# Patient Record
Sex: Female | Born: 1950 | Race: White | Hispanic: No | Smoking: Former smoker
Health system: Southern US, Community
[De-identification: ages and names within clinical notes are randomized; demographics above are authoritative.]

## PROBLEM LIST (undated history)

## (undated) DIAGNOSIS — C801 Malignant (primary) neoplasm, unspecified: Secondary | ICD-10-CM

## (undated) DIAGNOSIS — F32A Depression, unspecified: Secondary | ICD-10-CM

## (undated) DIAGNOSIS — F419 Anxiety disorder, unspecified: Secondary | ICD-10-CM

## (undated) DIAGNOSIS — F329 Major depressive disorder, single episode, unspecified: Secondary | ICD-10-CM

## (undated) DIAGNOSIS — I10 Essential (primary) hypertension: Secondary | ICD-10-CM

## (undated) DIAGNOSIS — C50919 Malignant neoplasm of unspecified site of unspecified female breast: Secondary | ICD-10-CM

## (undated) HISTORY — DX: Depression, unspecified: F32.A

## (undated) HISTORY — DX: Major depressive disorder, single episode, unspecified: F32.9

---

## 1996-10-30 HISTORY — PX: BREAST LUMPECTOMY: SHX2

## 1998-01-28 ENCOUNTER — Encounter: Admission: RE | Admit: 1998-01-28 | Discharge: 1998-04-28 | Payer: Self-pay | Admitting: *Deleted

## 1998-02-05 ENCOUNTER — Inpatient Hospital Stay (HOSPITAL_COMMUNITY): Admission: RE | Admit: 1998-02-05 | Discharge: 1998-02-07 | Payer: Self-pay | Admitting: Neurosurgery

## 1998-05-13 ENCOUNTER — Ambulatory Visit (HOSPITAL_COMMUNITY): Admission: RE | Admit: 1998-05-13 | Discharge: 1998-05-13 | Payer: Self-pay | Admitting: Endocrinology

## 1998-12-09 ENCOUNTER — Encounter: Payer: Self-pay | Admitting: Endocrinology

## 1998-12-09 ENCOUNTER — Ambulatory Visit (HOSPITAL_COMMUNITY): Admission: RE | Admit: 1998-12-09 | Discharge: 1998-12-09 | Payer: Self-pay | Admitting: Endocrinology

## 1999-05-09 ENCOUNTER — Other Ambulatory Visit: Admission: RE | Admit: 1999-05-09 | Discharge: 1999-05-09 | Payer: Self-pay | Admitting: Obstetrics and Gynecology

## 2000-10-08 ENCOUNTER — Encounter: Payer: Self-pay | Admitting: Oncology

## 2000-10-08 ENCOUNTER — Encounter: Admission: RE | Admit: 2000-10-08 | Discharge: 2000-10-08 | Payer: Self-pay | Admitting: Oncology

## 2001-11-08 ENCOUNTER — Encounter: Payer: Self-pay | Admitting: Oncology

## 2001-11-08 ENCOUNTER — Encounter: Admission: RE | Admit: 2001-11-08 | Discharge: 2001-11-08 | Payer: Self-pay | Admitting: Oncology

## 2002-03-11 ENCOUNTER — Encounter: Payer: Self-pay | Admitting: Oncology

## 2002-03-11 ENCOUNTER — Ambulatory Visit (HOSPITAL_COMMUNITY): Admission: RE | Admit: 2002-03-11 | Discharge: 2002-03-11 | Payer: Self-pay | Admitting: Oncology

## 2003-01-02 ENCOUNTER — Encounter: Admission: RE | Admit: 2003-01-02 | Discharge: 2003-01-02 | Payer: Self-pay | Admitting: Oncology

## 2003-01-02 ENCOUNTER — Encounter: Payer: Self-pay | Admitting: Oncology

## 2004-01-04 ENCOUNTER — Encounter: Admission: RE | Admit: 2004-01-04 | Discharge: 2004-01-04 | Payer: Self-pay | Admitting: Oncology

## 2005-02-27 ENCOUNTER — Ambulatory Visit: Payer: Self-pay | Admitting: Oncology

## 2005-03-10 ENCOUNTER — Encounter: Admission: RE | Admit: 2005-03-10 | Discharge: 2005-03-10 | Payer: Self-pay | Admitting: Oncology

## 2006-03-08 ENCOUNTER — Ambulatory Visit: Payer: Self-pay | Admitting: Oncology

## 2006-03-12 ENCOUNTER — Encounter: Admission: RE | Admit: 2006-03-12 | Discharge: 2006-03-12 | Payer: Self-pay | Admitting: Oncology

## 2006-03-12 LAB — COMPREHENSIVE METABOLIC PANEL
ALT: 23 U/L (ref 0–40)
AST: 19 U/L (ref 0–37)
Creatinine, Ser: 0.8 mg/dL (ref 0.4–1.2)
Sodium: 139 mEq/L (ref 135–145)
Total Bilirubin: 0.5 mg/dL (ref 0.3–1.2)
Total Protein: 7.1 g/dL (ref 6.0–8.3)

## 2006-03-12 LAB — CBC WITH DIFFERENTIAL/PLATELET
BASO%: 0.4 % (ref 0.0–2.0)
Basophils Absolute: 0 10*3/uL (ref 0.0–0.1)
EOS%: 1.7 % (ref 0.0–7.0)
Eosinophils Absolute: 0.1 10*3/uL (ref 0.0–0.5)
MONO#: 0.7 10*3/uL (ref 0.1–0.9)
Platelets: 316 10*3/uL (ref 145–400)
RBC: 4.4 10*6/uL (ref 3.70–5.32)
RDW: 13.2 % (ref 11.3–14.5)
lymph#: 2 10*3/uL (ref 0.9–3.3)

## 2007-03-07 ENCOUNTER — Ambulatory Visit: Payer: Self-pay | Admitting: Oncology

## 2007-03-15 ENCOUNTER — Encounter: Admission: RE | Admit: 2007-03-15 | Discharge: 2007-03-15 | Payer: Self-pay | Admitting: Oncology

## 2007-05-15 ENCOUNTER — Ambulatory Visit: Payer: Self-pay | Admitting: Oncology

## 2008-12-16 ENCOUNTER — Encounter: Admission: RE | Admit: 2008-12-16 | Discharge: 2008-12-16 | Payer: Self-pay | Admitting: Oncology

## 2009-07-19 ENCOUNTER — Observation Stay (HOSPITAL_COMMUNITY): Admission: EM | Admit: 2009-07-19 | Discharge: 2009-07-20 | Payer: Self-pay | Admitting: Emergency Medicine

## 2010-01-31 ENCOUNTER — Ambulatory Visit: Payer: Self-pay | Admitting: Surgery

## 2010-09-03 ENCOUNTER — Encounter: Admission: RE | Admit: 2010-09-03 | Discharge: 2010-09-03 | Payer: Self-pay | Admitting: Endocrinology

## 2010-09-21 ENCOUNTER — Encounter: Admission: RE | Admit: 2010-09-21 | Discharge: 2010-09-21 | Payer: Self-pay | Admitting: Endocrinology

## 2010-11-20 ENCOUNTER — Encounter: Payer: Self-pay | Admitting: Oncology

## 2011-02-03 LAB — CBC
HCT: 37.7 % (ref 36.0–46.0)
HCT: 41.8 % (ref 36.0–46.0)
Hemoglobin: 13 g/dL (ref 12.0–15.0)
Hemoglobin: 14.2 g/dL (ref 12.0–15.0)
MCHC: 34 g/dL (ref 30.0–36.0)
MCV: 95.4 fL (ref 78.0–100.0)
Platelets: 284 10*3/uL (ref 150–400)
RBC: 3.97 MIL/uL (ref 3.87–5.11)
RDW: 13.4 % (ref 11.5–15.5)
RDW: 13.7 % (ref 11.5–15.5)
WBC: 7.9 10*3/uL (ref 4.0–10.5)
WBC: 9.6 10*3/uL (ref 4.0–10.5)

## 2011-02-03 LAB — BASIC METABOLIC PANEL
CO2: 28 mEq/L (ref 19–32)
Calcium: 10.1 mg/dL (ref 8.4–10.5)
Chloride: 105 mEq/L (ref 96–112)
Chloride: 108 mEq/L (ref 96–112)
GFR calc Af Amer: >60 mL/min (ref >60)
Glucose, Bld: 106 mg/dL — ABNORMAL HIGH (ref 70–99)
Glucose, Bld: 93 mg/dL (ref 70–99)
Potassium: 3.8 mEq/L (ref 3.5–5.1)
Sodium: 139 mEq/L (ref 135–145)
Sodium: 140 mEq/L (ref 135–145)

## 2011-02-03 LAB — DIFFERENTIAL
Basophils Relative: 0 % (ref 0–1)
Monocytes Relative: 8 % (ref 3–12)
Neutro Abs: 6.7 10*3/uL (ref 1.7–7.7)
Neutrophils Relative %: 69 % (ref 43–77)

## 2011-02-03 LAB — BENZODIAZEPINE, QUANTITATIVE, URINE
Alprazolam (GC/LC/MS), ur confirm: 130 ng/mL
Flurazepam GC/MS Conf: NEGATIVE
Nordiazepam GC/MS Conf: NEGATIVE

## 2011-02-03 LAB — URINALYSIS, ROUTINE W REFLEX MICROSCOPIC
Nitrite: NEGATIVE
Specific Gravity, Urine: 1.013 (ref 1.005–1.030)

## 2011-02-03 LAB — LIPID PANEL
Cholesterol: 145 mg/dL (ref 0–200)
LDL Cholesterol: 79 mg/dL (ref 0–99)
Total CHOL/HDL Ratio: 3.3 RATIO
VLDL: 22 mg/dL (ref 0–40)

## 2011-02-03 LAB — CARDIAC PANEL(CRET KIN+CKTOT+MB+TROPI)
CK, MB: 2 ng/mL (ref 0.3–4.0)
CK, MB: 2 ng/mL (ref 0.3–4.0)
Relative Index: 1.5 (ref 0.0–2.5)
Relative Index: 1.5 (ref 0.0–2.5)
Troponin I: 0.01 ng/mL (ref 0.00–0.06)

## 2011-02-03 LAB — DRUGS OF ABUSE SCREEN W/O ALC, ROUTINE URINE
Cocaine Metabolites: NEGATIVE
Creatinine,U: 64 mg/dL
Phencyclidine (PCP): NEGATIVE
Propoxyphene: NEGATIVE

## 2011-02-03 LAB — URINE MICROSCOPIC-ADD ON

## 2011-02-03 LAB — BRAIN NATRIURETIC PEPTIDE: Pro B Natriuretic peptide (BNP): <30 pg/mL (ref 0.0–100.0)

## 2011-02-03 LAB — TSH: TSH: 3.078 u[IU]/mL (ref 0.350–4.500)

## 2011-03-14 NOTE — Procedures (Signed)
DUPLEX DEEP VENOUS EXAM - LOWER EXTREMITY   INDICATION:  Edema.   HISTORY:  Edema:  Yes.  Trauma/Surgery:  No.  Pain:  Yes.  PE:  No.  Previous DVT:  No.  Anticoagulants:  No.  Other:  No.   DUPLEX EXAM:                CFV   SFV   PopV  PTV    GSV                R  L  R  L  R  L  R   L  R  L  Thrombosis    o  o  o  o  o  o  o   o  o  o  Spontaneous   +  +  +  +  +  +  +   +  +  +  Phasic        +  +  +  +  +  +  +   +  +  +  Augmentation  +  +  +  +  +  +  +   +  +  +  Compressible  +  +  +  +  +  +  +   +  +  +  Competent     +  +  +  +  +  +  +   +  +  +   Legend:  + - yes  o - no  p - partial  D - decreased   IMPRESSION:  Bilateral lower extremities appear normal with no evidence  of deep venous thrombus noted.    _____________________________  V. Charlena Cross, MD   CB/MEDQ  D:  01/31/2010  T:  02/01/2010  Job:  [id number]

## 2011-11-22 ENCOUNTER — Other Ambulatory Visit: Payer: Self-pay | Admitting: Endocrinology

## 2011-11-22 DIAGNOSIS — Z1231 Encounter for screening mammogram for malignant neoplasm of breast: Secondary | ICD-10-CM

## 2011-12-15 ENCOUNTER — Ambulatory Visit: Payer: Self-pay

## 2013-01-30 ENCOUNTER — Encounter (HOSPITAL_COMMUNITY): Payer: Self-pay | Admitting: Emergency Medicine

## 2013-01-30 ENCOUNTER — Emergency Department (HOSPITAL_COMMUNITY): Payer: Commercial Managed Care - PPO

## 2013-01-30 ENCOUNTER — Ambulatory Visit (HOSPITAL_COMMUNITY)
Admission: EM | Admit: 2013-01-30 | Discharge: 2013-02-02 | Disposition: A | Discharge status: Home / Self Care | Payer: Commercial Managed Care - PPO | Attending: Orthopedic Surgery | Admitting: Orthopedic Surgery

## 2013-01-30 DIAGNOSIS — Y92009 Unspecified place in unspecified non-institutional (private) residence as the place of occurrence of the external cause: Secondary | ICD-10-CM | POA: Insufficient documentation

## 2013-01-30 DIAGNOSIS — I10 Essential (primary) hypertension: Secondary | ICD-10-CM | POA: Insufficient documentation

## 2013-01-30 DIAGNOSIS — W010XXA Fall on same level from slipping, tripping and stumbling without subsequent striking against object, initial encounter: Secondary | ICD-10-CM | POA: Insufficient documentation

## 2013-01-30 DIAGNOSIS — S82851A Displaced trimalleolar fracture of right lower leg, initial encounter for closed fracture: Secondary | ICD-10-CM

## 2013-01-30 DIAGNOSIS — S82843A Displaced bimalleolar fracture of unspecified lower leg, initial encounter for closed fracture: Secondary | ICD-10-CM | POA: Insufficient documentation

## 2013-01-30 HISTORY — DX: Essential (primary) hypertension: I10

## 2013-01-30 HISTORY — DX: Anxiety disorder, unspecified: F41.9

## 2013-01-30 LAB — CBC WITH DIFFERENTIAL/PLATELET
Basophils Absolute: 0 10*3/uL (ref 0.0–0.1)
Basophils Relative: 0 % (ref 0–1)
Eosinophils Absolute: 0.1 10*3/uL (ref 0.0–0.7)
Eosinophils Relative: 1 % (ref 0–5)
HCT: 37.8 % (ref 36.0–46.0)
Hemoglobin: 12.8 g/dL (ref 12.0–15.0)
Lymphocytes Relative: 18 % (ref 12–46)
Lymphs Abs: 1.6 10*3/uL (ref 0.7–4.0)
MCH: 31.7 pg (ref 26.0–34.0)
MCHC: 33.9 g/dL (ref 30.0–36.0)
MCV: 93.6 fL (ref 78.0–100.0)
Monocytes Absolute: 0.7 10*3/uL (ref 0.1–1.0)
Monocytes Relative: 8 % (ref 3–12)
Neutro Abs: 6.4 10*3/uL (ref 1.7–7.7)
Neutrophils Relative %: 73 % (ref 43–77)
Platelets: 201 10*3/uL (ref 150–400)
RBC: 4.04 MIL/uL (ref 3.87–5.11)
RDW: 13.2 % (ref 11.5–15.5)
WBC: 8.8 10*3/uL (ref 4.0–10.5)

## 2013-01-30 LAB — BASIC METABOLIC PANEL
BUN: 17 mg/dL (ref 6–23)
CO2: 28 mEq/L (ref 19–32)
Calcium: 8.7 mg/dL (ref 8.4–10.5)
Chloride: 106 mEq/L (ref 96–112)
Creatinine, Ser: 0.72 mg/dL (ref 0.50–1.10)
GFR calc Af Amer: >90 mL/min (ref >90)
GFR calc non Af Amer: >90 mL/min (ref >90)
Glucose, Bld: 132 mg/dL — ABNORMAL HIGH (ref 70–99)
Potassium: 4 mEq/L (ref 3.5–5.1)
Sodium: 139 mEq/L (ref 135–145)

## 2013-01-30 MED ORDER — MORPHINE SULFATE 4 MG/ML IJ SOLN
6.0000 mg | Freq: Once | INTRAMUSCULAR | Status: AC
  Administered 2013-01-30: 6 mg via INTRAVENOUS
  Filled 2013-01-30: qty 2

## 2013-01-30 MED ORDER — MORPHINE SULFATE 2 MG/ML IJ SOLN
2.0000 mg | Freq: Once | INTRAMUSCULAR | Status: AC
  Administered 2013-01-30: 2 mg via INTRAVENOUS
  Filled 2013-01-30: qty 1

## 2013-01-30 MED ORDER — ONDANSETRON HCL 4 MG/2ML IJ SOLN
4.0000 mg | Freq: Once | INTRAMUSCULAR | Status: AC
  Administered 2013-01-30: 4 mg via INTRAVENOUS
  Filled 2013-01-30: qty 2

## 2013-01-30 MED ORDER — SODIUM CHLORIDE 0.9 % IV BOLUS (SEPSIS)
1000.0000 mL | Freq: Once | INTRAVENOUS | Status: AC
Start: 2013-01-30 — End: 2013-01-31
  Administered 2013-01-30: 1000 mL via INTRAVENOUS

## 2013-01-30 NOTE — ED Provider Notes (Signed)
History     CSN: [id number]  Arrival date & time 01/30/13  2006   First MD Initiated Contact with Patient 01/30/13 2008      Chief Complaint  Patient presents with  . Ankle Injury    (Consider location/radiation/quality/duration/timing/severity/associated sxs/prior treatment) HPI Sandra Wagner is a 62 year old female with past history significant for HTN and anxiety who presents to the ED for right ankle pain. She was on the phone when her 100 lb pit bull ran into her legs and knocked her over. She reports her right leg buckled and she fell backwards. Initially she states her right leg felt numb. She tried to move her leg and immediately felt pain, 10/10, and sharp. She was unable to get up. Her husband was trying to help her in the car but she was unable to bend her leg due to pain, so they called EMS. She states the pain was excruciating and she also felt it in her right knee and hip when getting the splint put on. Currently, she states the pain is bearable as long as she doesn't move it or no one touches it. She did not take any medications at home but was given 250 mg fentanyl by EMS. She was given an ice pack in the ED.  Past Medical History  Diagnosis Date  . Hypertension   . Anxiety     History reviewed. No pertinent past surgical history.  No family history on file.  History  Substance Use Topics  . Smoking status: Not on file  . Smokeless tobacco: Not on file  . Alcohol Use: No    OB History   Grav Para Term Preterm Abortions TAB SAB Ect Mult Living                  Review of Systems All other systems negative except as documented in the HPI. All pertinent positives and negatives as reviewed in the HPI.  Allergies  Taxol and Shellfish allergy  Home Medications   Current Outpatient Rx  Name  Route  Sig  Dispense  Refill  . ALPRAZolam (XANAX) 0.5 MG tablet   Oral   Take 0.5 mg by mouth at bedtime.         Marland Kitchen ibuprofen (ADVIL,MOTRIN) 200 MG tablet   Oral  Take 400 mg by mouth every 8 (eight) hours as needed for pain.         . metoprolol succinate (TOPROL-XL) 50 MG 24 hr tablet   Oral   Take 50 mg by mouth daily. Take with or immediately following a meal.         . PARoxetine (PAXIL) 40 MG tablet   Oral   Take 40 mg by mouth at bedtime.           BP 118/66  Pulse 55  Temp(Src) 98.9 F (37.2 C) (Oral)  Resp 18  SpO2 100%  Physical Exam  Constitutional: She is oriented to person, place, and time. She appears well-developed and well-nourished. No distress.  Overweight.  HENT:  Head: Normocephalic and atraumatic.  Neck: Normal range of motion. Neck supple.  Cardiovascular: Normal rate, regular rhythm, normal heart sounds and intact distal pulses.   Pulmonary/Chest: Effort normal and breath sounds normal.  Musculoskeletal:       Right ankle: She exhibits decreased range of motion, swelling and deformity. She exhibits normal pulse. Tenderness. Medial malleolus tenderness found. No lateral malleolus, no AITFL, no CF ligament, no head of 5th metatarsal and no  proximal fibula tenderness found.       Feet:  Obvious deformity of right ankle. Moderate swelling of right ankle and lower right extremity. Right foot displaced in eversion. Patient is unable to move ankle. Patient is able to flex and extend digits. DP pulses intact. Patient exhibits tenderness along distal achilles tendon, medial malleolus, and navicular bone. No tenderness noted of proximal 2/3 of tibia, fibula, lateral malleolus, metatarsals, and MTPs.  Neurological: She is alert and oriented to person, place, and time.  Sensation intact to light touch and sharp/dull of right foot and ankle. Decreased strength with ankle ROM as patient is unable to dorsiflex or plantar flex. Strength 4/5 with digit flexion/extension.  Skin: Skin is warm and dry. No rash noted. No erythema.    ED Course  Procedures (including critical care time) Dr. supple, from orthopedics, was called and  came in to see the patient.  Patient is advised of her x-ray finding.  She will need admission for surgical intervention.   MDM  MDM Reviewed: vitals and nursing note Interpretation: labs, ECG and x-ray Consults: orthopedics   Date: 01/31/2013  Rate: 57  Rhythm: normal sinus rhythm  QRS Axis: normal  Intervals: normal  ST/T Wave abnormalities: nonspecific T wave changes  Conduction Disutrbances:none  Narrative Interpretation:   Old EKG Reviewed: unchanged            Carlyle Dolly, PA-C 01/31/13 0008

## 2013-01-30 NOTE — ED Notes (Signed)
WUJ:[id number]<JY> Expected date:<BR> Expected time:<BR> Means of arrival:<BR> Comments:<BR> EMS/61 yo female-tripped and fell right ankle deformity

## 2013-01-30 NOTE — ED Notes (Signed)
PER EMS- pt picked up from home with c/o r ankle injury. Pt reports dog was playing with ball and ran into pt and pt tripped and injury ankle.  No head injury, passed CSSA.  Alert and oriented.  Deformity noted to r ankle .  Given fentanyl pta.  22 l wrist IV. Pt c/o 10/10 pain.

## 2013-01-30 NOTE — H&P (Signed)
Sandra Wagner    Chief Complaint: right ankle fracture HPI: The patient is a 62 y.o. female s/p fall at home knocked over by her dog sustaining right ankle injury with immediate pain, deformity, and inability to bear weight  Past Medical History  Diagnosis Date  . Hypertension   . Anxiety     History reviewed. No pertinent past surgical history.  No family history on file.  Social History:  reports that she does not drink alcohol or use illicit drugs. Her tobacco history is not on file.  Allergies:  Allergies  Allergen Reactions  . Taxol (Paclitaxel) Anaphylaxis  . Shellfish Allergy Hives     (Not in a hospital admission)   Physical Exam: right ankle with valgus and external rotation deformity. 2+ pulse, skin intact, fair digital motion  Xray shows right trimalleolar ankle fx/dislocation.  Vitals  Temp:  [98.9 F (37.2 C)] 98.9 F (37.2 C) (04/03 2006) Pulse Rate:  [55-62] 62 (04/03 2232) Resp:  [16-18] 16 (04/03 2232) BP: (96-118)/(49-66) 96/49 mmHg (04/03 2232) SpO2:  [95 %-100 %] 95 % (04/03 2232)  Assessment/Plan  Impression: right ankle fracture. I have discussed with Sandra Wagner treatment options and risks vs benefits thereof. She understands and accepts and agrees with plan. Will try for OR time tomorrow. NPO  Plan of Action: Procedure(s): OPEN REDUCTION INTERNAL FIXATION (ORIF) ANKLE FRACTURE  Sandra Wagner M 01/30/2013, 11:44 PM

## 2013-01-30 NOTE — ED Notes (Signed)
Patient transported to X-ray 

## 2013-01-31 ENCOUNTER — Encounter (HOSPITAL_COMMUNITY): Payer: Self-pay | Admitting: Anesthesiology

## 2013-01-31 ENCOUNTER — Encounter (HOSPITAL_COMMUNITY)
Admission: EM | Disposition: A | Discharge status: Home / Self Care | Payer: Self-pay | Source: Home / Self Care | Attending: Emergency Medicine

## 2013-01-31 ENCOUNTER — Observation Stay (HOSPITAL_COMMUNITY): Payer: Commercial Managed Care - PPO

## 2013-01-31 ENCOUNTER — Observation Stay (HOSPITAL_COMMUNITY): Payer: Commercial Managed Care - PPO | Admitting: Anesthesiology

## 2013-01-31 HISTORY — PX: ORIF ANKLE FRACTURE: SHX5408

## 2013-01-31 LAB — CREATININE, SERUM
GFR calc Af Amer: >90 mL/min (ref >90)
GFR calc non Af Amer: >90 mL/min (ref >90)

## 2013-01-31 LAB — CBC
HCT: 38.3 % (ref 36.0–46.0)
Hemoglobin: 13 g/dL (ref 12.0–15.0)
RDW: 13.5 % (ref 11.5–15.5)
WBC: 11.3 10*3/uL — ABNORMAL HIGH (ref 4.0–10.5)

## 2013-01-31 LAB — PROTIME-INR: Prothrombin Time: 13 seconds (ref 11.6–15.2)

## 2013-01-31 LAB — SURGICAL PCR SCREEN: MRSA, PCR: NEGATIVE

## 2013-01-31 SURGERY — OPEN REDUCTION INTERNAL FIXATION (ORIF) ANKLE FRACTURE
Anesthesia: General | Laterality: Right

## 2013-01-31 SURGERY — OPEN REDUCTION INTERNAL FIXATION (ORIF) ANKLE FRACTURE
Anesthesia: General | Site: Ankle | Laterality: Right | Wound class: Clean

## 2013-01-31 MED ORDER — EPHEDRINE SULFATE 50 MG/ML IJ SOLN
INTRAMUSCULAR | Status: DC | PRN
  Administered 2013-01-31 (×2): 10 mg via INTRAVENOUS

## 2013-01-31 MED ORDER — TEMAZEPAM 15 MG PO CAPS: 15.0000 mg | ORAL_CAPSULE | Freq: Every evening | ORAL | Status: DC | PRN

## 2013-01-31 MED ORDER — DEXAMETHASONE SODIUM PHOSPHATE 4 MG/ML IJ SOLN
INTRAMUSCULAR | Status: DC | PRN
  Administered 2013-01-31: 10 mg

## 2013-01-31 MED ORDER — HYDROMORPHONE HCL PF 1 MG/ML IJ SOLN: 0.5000 mg | INTRAMUSCULAR | Status: DC | PRN

## 2013-01-31 MED ORDER — ALPRAZOLAM 0.5 MG PO TABS
0.5000 mg | ORAL_TABLET | Freq: Every day | ORAL | Status: DC
  Administered 2013-01-31 – 2013-02-01 (×2): 0.5 mg via ORAL
  Filled 2013-01-31 (×2): qty 1

## 2013-01-31 MED ORDER — METOCLOPRAMIDE HCL 5 MG/ML IJ SOLN: 5.0000 mg | Freq: Three times a day (TID) | INTRAMUSCULAR | Status: DC | PRN

## 2013-01-31 MED ORDER — ONDANSETRON HCL 4 MG PO TABS: 4.0000 mg | ORAL_TABLET | Freq: Four times a day (QID) | ORAL | Status: DC | PRN

## 2013-01-31 MED ORDER — LACTATED RINGERS IV SOLN
INTRAVENOUS | Status: DC | PRN
  Administered 2013-01-31: 13:00:00 via INTRAVENOUS

## 2013-01-31 MED ORDER — ONDANSETRON HCL 4 MG/2ML IJ SOLN: 4.0000 mg | Freq: Four times a day (QID) | INTRAMUSCULAR | Status: DC | PRN

## 2013-01-31 MED ORDER — FENTANYL CITRATE 0.05 MG/ML IJ SOLN
INTRAMUSCULAR | Status: DC | PRN
  Administered 2013-01-31 (×2): 50 ug via INTRAVENOUS
  Administered 2013-01-31: 100 ug via INTRAVENOUS
  Administered 2013-01-31: 50 ug via INTRAVENOUS

## 2013-01-31 MED ORDER — METHOCARBAMOL 500 MG PO TABS: 500.0000 mg | ORAL_TABLET | Freq: Three times a day (TID) | ORAL | Status: DC | PRN

## 2013-01-31 MED ORDER — OXYCODONE-ACETAMINOPHEN 5-325 MG PO TABS: 1.0000 | ORAL_TABLET | ORAL | Status: DC | PRN

## 2013-01-31 MED ORDER — MORPHINE SULFATE 2 MG/ML IJ SOLN
1.0000 mg | INTRAMUSCULAR | Status: DC | PRN
  Administered 2013-01-31: 1 mg via INTRAVENOUS
  Filled 2013-01-31: qty 1

## 2013-01-31 MED ORDER — CEFAZOLIN SODIUM-DEXTROSE 2-3 GM-% IV SOLR
2.0000 g | INTRAVENOUS | Status: AC
  Administered 2013-01-31: 2 g via INTRAVENOUS
  Filled 2013-01-31: qty 50

## 2013-01-31 MED ORDER — METOCLOPRAMIDE HCL 10 MG PO TABS: 5.0000 mg | ORAL_TABLET | Freq: Three times a day (TID) | ORAL | Status: DC | PRN

## 2013-01-31 MED ORDER — METHOCARBAMOL 100 MG/ML IJ SOLN
500.0000 mg | Freq: Four times a day (QID) | INTRAVENOUS | Status: DC | PRN
  Filled 2013-01-31: qty 5

## 2013-01-31 MED ORDER — OXYCODONE-ACETAMINOPHEN 5-325 MG PO TABS
1.0000 | ORAL_TABLET | ORAL | Status: DC | PRN
  Administered 2013-01-31 (×2): 2 via ORAL
  Filled 2013-01-31 (×2): qty 2

## 2013-01-31 MED ORDER — METOPROLOL SUCCINATE ER 50 MG PO TB24
50.0000 mg | ORAL_TABLET | Freq: Every day | ORAL | Status: DC
  Administered 2013-02-01: 50 mg via ORAL
  Filled 2013-01-31 (×3): qty 1

## 2013-01-31 MED ORDER — BISACODYL 5 MG PO TBEC: 5.0000 mg | DELAYED_RELEASE_TABLET | Freq: Every day | ORAL | Status: DC | PRN

## 2013-01-31 MED ORDER — LACTATED RINGERS IV SOLN
INTRAVENOUS | Status: DC
  Administered 2013-01-31 (×2): via INTRAVENOUS

## 2013-01-31 MED ORDER — CEFAZOLIN SODIUM 1-5 GM-% IV SOLN
1.0000 g | Freq: Four times a day (QID) | INTRAVENOUS | Status: AC
  Administered 2013-01-31 – 2013-02-01 (×2): 1 g via INTRAVENOUS
  Filled 2013-01-31 (×3): qty 50

## 2013-01-31 MED ORDER — DOCUSATE SODIUM 100 MG PO CAPS
100.0000 mg | ORAL_CAPSULE | Freq: Two times a day (BID) | ORAL | Status: DC
  Administered 2013-01-31 – 2013-02-02 (×4): 100 mg via ORAL
  Filled 2013-01-31 (×6): qty 1

## 2013-01-31 MED ORDER — PAROXETINE HCL 20 MG PO TABS
40.0000 mg | ORAL_TABLET | Freq: Every day | ORAL | Status: DC
  Administered 2013-01-31 – 2013-02-01 (×2): 40 mg via ORAL
  Filled 2013-01-31 (×3): qty 2

## 2013-01-31 MED ORDER — MIDAZOLAM HCL 5 MG/5ML IJ SOLN
INTRAMUSCULAR | Status: DC | PRN
  Administered 2013-01-31: 1 mg via INTRAVENOUS

## 2013-01-31 MED ORDER — FLEET ENEMA 7-19 GM/118ML RE ENEM: 1.0000 | ENEMA | Freq: Once | RECTAL | Status: AC | PRN

## 2013-01-31 MED ORDER — SODIUM CHLORIDE 0.9 % IV SOLN
INTRAVENOUS | Status: DC
  Administered 2013-01-31: 01:00:00 via INTRAVENOUS

## 2013-01-31 MED ORDER — ASPIRIN EC 81 MG PO TBEC: 81.0000 mg | DELAYED_RELEASE_TABLET | Freq: Every day | ORAL | Status: AC

## 2013-01-31 MED ORDER — ONDANSETRON HCL 4 MG/2ML IJ SOLN
4.0000 mg | Freq: Four times a day (QID) | INTRAMUSCULAR | Status: DC | PRN
  Administered 2013-01-31: 4 mg via INTRAVENOUS
  Filled 2013-01-31: qty 2

## 2013-01-31 MED ORDER — OXYCODONE HCL 5 MG PO TABS
5.0000 mg | ORAL_TABLET | Freq: Once | ORAL | Status: AC | PRN
  Administered 2013-01-31: 5 mg via ORAL

## 2013-01-31 MED ORDER — DIPHENHYDRAMINE HCL 12.5 MG/5ML PO ELIX: 12.5000 mg | ORAL_SOLUTION | ORAL | Status: DC | PRN

## 2013-01-31 MED ORDER — ONDANSETRON HCL 4 MG/2ML IJ SOLN
INTRAMUSCULAR | Status: DC | PRN
  Administered 2013-01-31: 4 mg via INTRAVENOUS

## 2013-01-31 MED ORDER — ENOXAPARIN SODIUM 40 MG/0.4ML ~~LOC~~ SOLN
40.0000 mg | SUBCUTANEOUS | Status: DC
  Administered 2013-01-31 – 2013-02-01 (×2): 40 mg via SUBCUTANEOUS
  Filled 2013-01-31 (×3): qty 0.4

## 2013-01-31 MED ORDER — PROPOFOL 10 MG/ML IV BOLUS
INTRAVENOUS | Status: DC | PRN
  Administered 2013-01-31: 150 mg via INTRAVENOUS
  Administered 2013-01-31: 30 mg via INTRAVENOUS

## 2013-01-31 MED ORDER — OXYCODONE-ACETAMINOPHEN 5-325 MG PO TABS
1.0000 | ORAL_TABLET | ORAL | Status: DC | PRN
  Administered 2013-01-31 – 2013-02-02 (×10): 2 via ORAL
  Filled 2013-01-31 (×11): qty 2

## 2013-01-31 MED ORDER — BACITRACIN-NEOMYCIN-POLYMYXIN 400-5-5000 EX OINT
TOPICAL_OINTMENT | CUTANEOUS | Status: AC
  Filled 2013-01-31: qty 1

## 2013-01-31 MED ORDER — 0.9 % SODIUM CHLORIDE (POUR BTL) OPTIME
TOPICAL | Status: DC | PRN
  Administered 2013-01-31: 1000 mL

## 2013-01-31 MED ORDER — OXYCODONE HCL 5 MG/5ML PO SOLN: 5.0000 mg | Freq: Once | ORAL | Status: AC | PRN

## 2013-01-31 MED ORDER — LACTATED RINGERS IV SOLN: INTRAVENOUS | Status: DC | PRN

## 2013-01-31 MED ORDER — PROMETHAZINE HCL 25 MG/ML IJ SOLN: 6.2500 mg | INTRAMUSCULAR | Status: DC | PRN

## 2013-01-31 MED ORDER — METHOCARBAMOL 500 MG PO TABS
500.0000 mg | ORAL_TABLET | Freq: Four times a day (QID) | ORAL | Status: DC | PRN
  Administered 2013-01-31 – 2013-02-02 (×7): 500 mg via ORAL
  Filled 2013-01-31 (×6): qty 1

## 2013-01-31 MED ORDER — HYDROMORPHONE HCL PF 1 MG/ML IJ SOLN: 0.2500 mg | INTRAMUSCULAR | Status: DC | PRN

## 2013-01-31 MED ORDER — BUPIVACAINE-EPINEPHRINE PF 0.5-1:200000 % IJ SOLN
INTRAMUSCULAR | Status: DC | PRN
  Administered 2013-01-31: 150 mg

## 2013-01-31 MED ORDER — POLYETHYLENE GLYCOL 3350 17 G PO PACK: 17.0000 g | PACK | Freq: Every day | ORAL | Status: DC | PRN

## 2013-01-31 SURGICAL SUPPLY — 76 items
BANDAGE ELASTIC 4 VELCRO ST LF (GAUZE/BANDAGES/DRESSINGS) ×2 IMPLANT
BANDAGE ELASTIC 6 VELCRO ST LF (GAUZE/BANDAGES/DRESSINGS) ×2 IMPLANT
BANDAGE ESMARK 6X9 LF (GAUZE/BANDAGES/DRESSINGS) ×1 IMPLANT
BIT DRILL 2.5X110 QC LCP DISP (BIT) ×1 IMPLANT
BIT DRILL 2.8 (BIT) ×1
BIT DRILL CANN 2.7X625 NONSTRL (BIT) ×1 IMPLANT
BIT DRILL CANN QC 2.8X165 (BIT) IMPLANT
BLADE SURG 10 STRL SS (BLADE) ×2 IMPLANT
BLADE SURG ROTATE 9660 (MISCELLANEOUS) IMPLANT
BNDG CMPR 9X6 STRL LF SNTH (GAUZE/BANDAGES/DRESSINGS) ×1
BNDG ESMARK 6X9 LF (GAUZE/BANDAGES/DRESSINGS) ×2
CLOTH BEACON ORANGE TIMEOUT ST (SAFETY) ×2 IMPLANT
COVER MAYO STAND STRL (DRAPES) ×2 IMPLANT
COVER SURGICAL LIGHT HANDLE (MISCELLANEOUS) ×2 IMPLANT
CUFF TOURNIQUET SINGLE 18IN (TOURNIQUET CUFF) IMPLANT
CUFF TOURNIQUET SINGLE 24IN (TOURNIQUET CUFF) IMPLANT
CUFF TOURNIQUET SINGLE 34IN LL (TOURNIQUET CUFF) ×2 IMPLANT
CUFF TOURNIQUET SINGLE 44IN (TOURNIQUET CUFF) IMPLANT
DRAPE C-ARM 42X72 X-RAY (DRAPES) ×1 IMPLANT
DRAPE INCISE IOBAN 66X45 STRL (DRAPES) ×1 IMPLANT
DRAPE LAPAROTOMY T 102X78X121 (DRAPES) IMPLANT
DRAPE OEC MINIVIEW 54X84 (DRAPES) IMPLANT
DRAPE PROXIMA HALF (DRAPES) IMPLANT
DRAPE SURG 17X23 STRL (DRAPES) IMPLANT
DRAPE U-SHAPE 47X51 STRL (DRAPES) ×2 IMPLANT
DRILL BIT 2.8MM (BIT) ×2
DRSG ADAPTIC 3X8 NADH LF (GAUZE/BANDAGES/DRESSINGS) ×2 IMPLANT
DRSG PAD ABDOMINAL 8X10 ST (GAUZE/BANDAGES/DRESSINGS) ×4 IMPLANT
DURAPREP 26ML APPLICATOR (WOUND CARE) ×2 IMPLANT
ELECT REM PT RETURN 9FT ADLT (ELECTROSURGICAL) ×2
ELECTRODE REM PT RTRN 9FT ADLT (ELECTROSURGICAL) ×1 IMPLANT
FACESHIELD LNG OPTICON STERILE (SAFETY) ×2 IMPLANT
GLOVE BIO SURGEON STRL SZ7.5 (GLOVE) ×2 IMPLANT
GLOVE BIO SURGEON STRL SZ8 (GLOVE) ×2 IMPLANT
GLOVE ECLIPSE 7.5 STRL STRAW (GLOVE) ×1 IMPLANT
GLOVE EUDERMIC 7 POWDERFREE (GLOVE) ×2 IMPLANT
GLOVE SS BIOGEL STRL SZ 7.5 (GLOVE) ×1 IMPLANT
GLOVE SUPERSENSE BIOGEL SZ 7.5 (GLOVE) ×1
GLOVE SURG SS PI 6.0 STRL IVOR (GLOVE) ×1 IMPLANT
GLOVE SURG SS PI 6.5 STRL IVOR (GLOVE) ×1 IMPLANT
GOWN STRL NON-REIN LRG LVL3 (GOWN DISPOSABLE) ×2 IMPLANT
GOWN STRL REIN XL XLG (GOWN DISPOSABLE) ×4 IMPLANT
GUIDEWIRE THREADED 150MM (WIRE) ×2 IMPLANT
KIT BASIN OR (CUSTOM PROCEDURE TRAY) ×2 IMPLANT
KIT ROOM TURNOVER OR (KITS) ×2 IMPLANT
MANIFOLD NEPTUNE II (INSTRUMENTS) ×2 IMPLANT
NEEDLE 22X1 1/2 (OR ONLY) (NEEDLE) IMPLANT
NS IRRIG 1000ML POUR BTL (IV SOLUTION) ×2 IMPLANT
PACK ORTHO EXTREMITY (CUSTOM PROCEDURE TRAY) ×2 IMPLANT
PAD ARMBOARD 7.5X6 YLW CONV (MISCELLANEOUS) ×4 IMPLANT
PAD CAST 4YDX4 CTTN HI CHSV (CAST SUPPLIES) ×2 IMPLANT
PADDING CAST COTTON 4X4 STRL (CAST SUPPLIES) ×4
PLATE LCP 3.5 1/3 TUB 7HX81 (Plate) ×1 IMPLANT
SCREW CANN S THRD/44 4.0 (Screw) ×2 IMPLANT
SCREW CORTEX 3.5 14MM (Screw) ×4 IMPLANT
SCREW LOCK CORT ST 3.5X14 (Screw) ×4 IMPLANT
SCREW LOCK T15 FT 14X3.5X2.9X (Screw) IMPLANT
SCREW LOCK T15 FT 16X3.5X2.9X (Screw) IMPLANT
SCREW LOCKING 3.5X14 (Screw) ×4 IMPLANT
SCREW LOCKING 3.5X16 (Screw) ×2 IMPLANT
SPONGE GAUZE 4X4 12PLY (GAUZE/BANDAGES/DRESSINGS) ×2 IMPLANT
SPONGE LAP 4X18 X RAY DECT (DISPOSABLE) ×4 IMPLANT
STAPLER VISISTAT 35W (STAPLE) ×2 IMPLANT
STRIP CLOSURE SKIN 1/2X4 (GAUZE/BANDAGES/DRESSINGS) ×4 IMPLANT
SUCTION FRAZIER TIP 10 FR DISP (SUCTIONS) ×2 IMPLANT
SUT MNCRL AB 3-0 PS2 18 (SUTURE) ×4 IMPLANT
SUT VIC AB 0 CT1 27 (SUTURE)
SUT VIC AB 0 CT1 27XBRD ANBCTR (SUTURE) IMPLANT
SUT VIC AB 2-0 CT1 27 (SUTURE) ×6
SUT VIC AB 2-0 CT1 TAPERPNT 27 (SUTURE) ×3 IMPLANT
SYR CONTROL 10ML LL (SYRINGE) IMPLANT
TOWEL OR 17X24 6PK STRL BLUE (TOWEL DISPOSABLE) ×2 IMPLANT
TOWEL OR 17X26 10 PK STRL BLUE (TOWEL DISPOSABLE) ×2 IMPLANT
TUBE CONNECTING 12X1/4 (SUCTIONS) ×2 IMPLANT
WASHER 7MM DIA (Washer) ×2 IMPLANT
WATER STERILE IRR 1000ML POUR (IV SOLUTION) ×2 IMPLANT

## 2013-01-31 NOTE — Op Note (Signed)
01/30/2013 - 01/31/2013  1:17 PM  PATIENT:   Sandra Wagner  62 y.o. female  PRE-OPERATIVE DIAGNOSIS:  Ankle fracture, right, closed, initial encounter [824.8]  POST-OPERATIVE DIAGNOSIS:  same  PROCEDURE:  ORIF  SURGEON:  Anaya Bovee, Vania Rea M.D.  ASSISTANTS: Shuford pac   ANESTHESIA:   GET + regional block  EBL: min  SPECIMEN:  none  Drains: none  TT apporx 75 min   PATIENT DISPOSITION:  PACU - hemodynamically stable.    PLAN OF CARE: Admit for overnight observation  Dictation# [id number]

## 2013-01-31 NOTE — Anesthesia Preprocedure Evaluation (Addendum)
Anesthesia Evaluation    Reviewed: Allergy & Precautions, H&P , NPO status , Patient's Chart, lab work & pertinent test results  History of Anesthesia Complications Negative for: history of anesthetic complications  Airway Mallampati: I TM Distance: >3 FB Neck ROM: Full    Dental  (+) Teeth Intact and Dental Advisory Given   Pulmonary neg pulmonary ROS,    Pulmonary exam normal       Cardiovascular hypertension, Rhythm:Regular     Neuro/Psych PSYCHIATRIC DISORDERS Anxiety negative neurological ROS     GI/Hepatic negative GI ROS, Neg liver ROS,   Endo/Other    Renal/GU negative Renal ROS     Musculoskeletal   Abdominal Normal abdominal exam  (+)   Peds  Hematology   Anesthesia Other Findings   Reproductive/Obstetrics                          Anesthesia Physical Anesthesia Plan  ASA: II  Anesthesia Plan:    Post-op Pain Management:    Induction: Intravenous  Airway Management Planned: LMA  Additional Equipment:   Intra-op Plan:   Post-operative Plan:   Informed Consent:   Plan Discussed with: CRNA, Anesthesiologist and Surgeon  Anesthesia Plan Comments:         Anesthesia Quick Evaluation

## 2013-01-31 NOTE — Transfer of Care (Signed)
Immediate Anesthesia Transfer of Care Note  Patient: Sandra Wagner  Procedure(s) Performed: Procedure(s): OPEN REDUCTION INTERNAL FIXATION (ORIF) ANKLE FRACTURE (Right)  Patient Location: PACU  Anesthesia Type:General  Level of Consciousness: awake, alert  and patient cooperative  Airway & Oxygen Therapy: Patient Spontanous Breathing and Patient connected to nasal cannula oxygen  Post-op Assessment: Report given to PACU RN, Post -op Vital signs reviewed and stable and Patient moving all extremities  Post vital signs: Reviewed and stable  Complications: No apparent anesthesia complications

## 2013-01-31 NOTE — ED Notes (Signed)
Secretary calling [facility] for transport

## 2013-01-31 NOTE — ED Notes (Signed)
Pt being transferred to [facility] for OR availabilty

## 2013-01-31 NOTE — Anesthesia Procedure Notes (Addendum)
Anesthesia Regional Block:  Popliteal block  Pre-Anesthetic Checklist: ,, timeout performed, Correct Patient, Correct Site, Correct Laterality, Correct Procedure,, site marked, risks and benefits discussed, Surgical consent,  Pre-op evaluation,  At surgeon's request and post-op pain management  Laterality: Right  Prep: chloraprep       Needles:  Injection technique: Single-shot  Needle Type: Echogenic Stimulator Needle          Additional Needles:  Procedures: ultrasound guided (picture in chart) and nerve stimulator Popliteal block  Nerve Stimulator or Paresthesia:  Response: plantar flexion, 0.45 mA,   Additional Responses:   Narrative:  Start time: 01/31/2013 11:20 AM End time: 01/31/2013 11:30 AM  Performed by: Personally  Anesthesiologist: J. Adonis Huguenin, MD  Additional Notes: A functioning IV was confirmed and monitors were applied.  Sterile prep and drape, hand hygiene and sterile gloves were used.  Negative aspiration and test dose prior to incremental administration of local anesthetic. The patient tolerated the procedure well.Ultrasound  guidance: relevant anatomy identified, needle position confirmed, local anesthetic spread visualized around nerve(s), vascular puncture avoided.  Image printed for medical record.   Popliteal block Procedure Name: LMA Insertion Date/Time: 01/31/2013 11:52 AM Performed by: Coralee Rud Pre-anesthesia Checklist: Patient identified, Emergency Drugs available, Suction available, Patient being monitored and Timeout performed Patient Re-evaluated:Patient Re-evaluated prior to inductionOxygen Delivery Method: Circle system utilized Preoxygenation: Pre-oxygenation with 100% oxygen Intubation Type: IV induction Ventilation: Mask ventilation without difficulty LMA: LMA inserted LMA Size: 4.0 Number of attempts: 1 Tube secured with: Tape Dental Injury: Teeth and Oropharynx as per pre-operative assessment

## 2013-01-31 NOTE — ED Notes (Signed)
Dr Rennis Chris gave permission for patient to take her nighttime medications...xanax 0.5mg  and paxil 25mg , her metoprolol was held due to her blood pressure dropping earlier.

## 2013-01-31 NOTE — ED Notes (Signed)
[facility] in route to transport patient

## 2013-01-31 NOTE — ED Provider Notes (Signed)
Medical screening examination/treatment/procedure(s) were conducted as a shared visit with non-physician practitioner(s) and myself.  I personally evaluated the patient during the encounter  Sandra Wagner is a 62 y.o. female s/p trip and fell and had R ankle pain. Obvious dislocation on exam but good pulses and able to wiggle toes. She has trimaleolar fracture on xray. Dr. Rennis Chris from ortho admitted the patient and will take her to surgery.    Richardean Canal, MD 01/31/13 [id number]

## 2013-02-01 NOTE — Evaluation (Signed)
Physical Therapy Evaluation Patient Details Name: Sandra Wagner MRN: [id number] DOB: 08/14/1951 Today's Date: 02/01/2013 Time: 1002-1033 PT Time Calculation (min): 31 min  PT Assessment / Plan / Recommendation Clinical Impression  Pt is a 62 y/o female s/p ORIF for R Ankle fx.  No weight bearing status specified in chart. Educated pt in NWB on RLE.   Acute PT will see pt once more to practice stair negotiation.  Pt will benefit from a RW.      PT Assessment  Patient needs continued PT services    Follow Up Recommendations  No PT follow up    Does the patient have the potential to tolerate intense rehabilitation      Barriers to Discharge        Equipment Recommendations  Rolling walker with 5" wheels    Recommendations for Other Services     Frequency Min 6X/week    Precautions / Restrictions Precautions Precautions: Fall Restrictions Weight Bearing Restrictions: Yes RLE Weight Bearing: Non weight bearing   Pertinent Vitals/Pain 5/10 pain in foot/ankle.  Premedicated.      Mobility  Bed Mobility Bed Mobility: Not assessed Transfers Transfers: Sit to Stand;Stand to Sit Sit to Stand: 5: Supervision Stand to Sit: 5: Supervision Details for Transfer Assistance: instructed pt to ambulate with NWB on R LE.  Ambulation/Gait Ambulation Distance (Feet): 15 Feet Assistive device: Rolling walker Ambulation/Gait Assistance Details: min guard assist for safety.  Gait Pattern: Step-to pattern Stairs: Yes Stairs Assistance: 3: Mod assist Stairs Assistance Details (indicate cue type and reason): Instructed pt in posterior technique and anterior technique with RW.  Stair Management Technique: One rail Right;With walker;Backwards Number of Stairs: 2 Wheelchair Mobility Wheelchair Mobility: No    Exercises     PT Diagnosis: Difficulty walking;Acute pain  PT Problem List: Decreased mobility;Pain;Obesity PT Treatment Interventions: Stair training;Gait training;DME  instruction;Therapeutic activities   PT Goals Acute Rehab PT Goals PT Goal Formulation: With patient Time For Goal Achievement: 02/08/13 Potential to Achieve Goals: Good Pt will Ambulate: 16 - 50 feet;with modified independence;with rolling walker PT Goal: Ambulate - Progress: Goal set today Pt will Go Up / Down Stairs: 1-2 stairs;with min assist;with rolling walker PT Goal: Up/Down Stairs - Progress: Goal set today  Visit Information  Last PT Received On: 02/01/13    Subjective Data  Subjective: Agree to PT eval.    Prior Functioning  Home Living Lives With: Spouse Available Help at Discharge: Available PRN/intermittently;Family Type of Home: House Home Access: Stairs to enter Secretary/administrator of Steps: 2 Entrance Stairs-Rails: Left Home Layout: Two level Bathroom Shower/Tub: Forensic scientist: Standard Home Adaptive Equipment: None Prior Function Level of Independence: Independent Able to Take Stairs?: Yes Driving: Yes Vocation: Full time employment Communication Communication: No difficulties    Cognition  Cognition Overall Cognitive Status: Appears within functional limits for tasks assessed/performed Arousal/Alertness: Lethargic Orientation Level: Oriented X4 / Intact Behavior During Session: [facility] for tasks performed    Extremity/Trunk Assessment Right Upper Extremity Assessment RUE ROM/Strength/Tone: Within functional levels Left Upper Extremity Assessment LUE ROM/Strength/Tone: Within functional levels Right Lower Extremity Assessment RLE ROM/Strength/Tone: Unable to fully assess Left Lower Extremity Assessment LLE ROM/Strength/Tone: [facility] for tasks assessed   Balance Balance Balance Assessed: No  End of Session PT - End of Session Equipment Utilized During Treatment: Gait belt;Left knee immobilizer Activity Tolerance: Patient limited by pain;Patient limited by fatigue;Patient tolerated treatment well Patient left: with call  bell/phone within reach  GP     [facility]  02/01/2013, 1:55 PM Tony Friscia L. Kenae Lindquist DPT [phone]

## 2013-02-01 NOTE — Op Note (Signed)
NAMEMarland Kitchen  Sandra, Wagner NO.:  [id number]  MEDICAL RECORD NO.:  [id number]  LOCATION:  [id number]                        FACILITY:  [facility]  PHYSICIAN:  Vania Rea. Leyanna Bittman, M.D.  DATE OF BIRTH:  1951-06-14  DATE OF PROCEDURE:  01/31/2013 DATE OF DISCHARGE:                              OPERATIVE REPORT   PREOPERATIVE DIAGNOSIS:  Displaced right ankle bimalleolar fracture dislocation.  POSTOPERATIVE DIAGNOSIS:  Displaced right ankle bimalleolar fracture dislocation.  PROCEDURE:  Open reduction and internal fixation of displaced right ankle bimalleolar fracture dislocation.  SURGEON:  Vania Rea. Aysa Larivee, M.D.  Threasa HeadsFrench Ana A. Shuford, PA-C  ANESTHESIA:  General endotracheal as well as a popliteal block.  TOURNIQUET TIME:  Approximately 75 minutes.  BLOOD LOSS:  Minimal.  DRAINS:  None.  HISTORY:  Sandra Wagner is a 62 year old female, who was knocked over by her dog at home last night, falling and sustaining a right ankle injury with immediate complaints of pain, swelling deformity, inability to bear weight.  She is brought to the [facility] Emergency room by EMS where she was found to have obvious deformity of the right ankle.  I performed a closed reduction with IV sedation.  After reviewing the films, it did show a bimalleolar fracture dislocation.  She was subsequently admitted, transferred to [facility], brought to the operating room today for planned ORIF.  We counseled Sandra Wagner on treatment options as well as risks versus benefits thereof.  Possible surgical complications were reviewed including potential for bleeding, infection, neurovascular injury, DVT, PE, malunion, nonunion, loss of fixation, and possible need for additional surgery.  She understands and accepts and agrees with our planned procedure.  PROCEDURE IN DETAIL:  After undergoing routine preop evaluation, the patient received prophylactic antibiotics and popliteal block was established  in the holding area by the Anesthesia Department.  Did receive prophylactic antibiotics.  Brought to the operating room, placed supine on the operative table, underwent smooth induction of a general endotracheal anesthesia.  Turned lateral to the right thigh.  Right leg was sterilely prepped and draped in standard fashion.  Time-out was called.  Leg was exsanguinated with the tourniquet inflated to 350 mmHg. I made an anterior curvilinear hockey stick incision about the medial malleolus.  Skin flaps were elevated and incision approximately 6 cm in length.  Skin flaps elevated and underlying vasculature was protected, retracted out of harm's way.  Dissection was carried deep with the fracture site which was exposed with combination of blunt and sharp dissection.  This was irrigated and meticulously cleaned.  We inspected the talar dome which did show some chondral damage, but no loose fragments.  We irrigated the joint and then turned our attention laterally.  We made a 8 cm longitudinal incision over the distal fibula. Skin flaps elevated, dissection carried deeply at the lateral margin of the fibula and the soft tissues and peroneal tendons were then reflected posteriorly.  The fracture site was exposed and all interposed soft tissue was meticulously removed.  Under direct visualization, the fracture was then reduced and a 7 hole locking 1/3rd tubular plate was contoured to fit over the lateral margin of the distal fibula and  was then transfixed with standard technique using combination of cortical locking and nonlocking screws.  Excellent fit and fixation was achieved. The overall construct was much to our satisfaction.  Fluoroscopic images showed good alignment of the fracture site, good position of hardware. We then returned our attention medially.  Under direct visualization, the medial malleolar fragment was directly reduced, helped temporarily with bone clamp and guide pins for the  4.0 cannulated screws were placed.  Two 44 screws with washer was then placed after the pilot holes have been drilled.  An excellent fixation was achieved with good compression.  Guidewire was removed.  Final fluoroscopic images showed good position of hardware and good position of the fracture site. Wounds were irrigated and medially closed with 2-0 Vicryl for subcu followed by Steri-Strips, Laterally 0 Vicryl, 2-0 Vicryl, and then Steri- Strips.  Bulky dry dressing was applied followed by well-padded short- leg stirrup splint with ankle in neutral position.  The tourniquet was then let down.  The patient was awakened, extubated, and taken to recovery room in stable condition.  Ralene Bathe, PA- C was used as an Geophysicist/field seismologist throughout this case essential for help with positioning of extremity, maintenance of reduction, retraction, positioning of extremity due to the patient's morbid obesity, wound closure, and intraoperative decision making.     Vania Rea. Kemora Pinard, M.D.     KMS/MEDQ  D:  01/31/2013  T:  02/01/2013  Job:  [id number]

## 2013-02-01 NOTE — Progress Notes (Signed)
Subjective: 1 Day Post-Op Procedure(s) (LRB): OPEN REDUCTION INTERNAL FIXATION (ORIF) ANKLE FRACTURE (Right) Patient reports pain as 5 on 0-10 scale.   complaijns of pain as expected. Has not been up yet.  Objective: Vital signs in last 24 hours: Temp:  [98.1 F (36.7 C)-98.7 F (37.1 C)] 98.7 F (37.1 C) (04/05 0429) Pulse Rate:  [60-97] 60 (04/05 0429) Resp:  [16-25] 16 (04/05 0429) BP: (97-125)/(52-72) 97/52 mmHg (04/05 0429) SpO2:  [93 %-100 %] 98 % (04/05 0429)  Intake/Output from previous day: 04/04 0701 - 04/05 0700 In: 1500 [I.V.:1500] Out: -  Intake/Output this shift:     Recent Labs  01/30/13 2300 01/31/13 1656  HGB 12.8 13.0    Recent Labs  01/30/13 2300 01/31/13 1656  WBC 8.8 11.3*  RBC 4.04 4.08  HCT 37.8 38.3  PLT 201 190    Recent Labs  01/30/13 2300 01/31/13 1656  NA 139  --   K 4.0  --   CL 106  --   CO2 28  --   BUN 17  --   CREATININE 0.72 0.63  GLUCOSE 132*  --   CALCIUM 8.7  --     Recent Labs  01/30/13 2300  INR 0.99    Sensation intact distally Splint circ and cap refill ok. Assessment/Plan: 1 Day Post-Op Procedure(s) (LRB): OPEN REDUCTION INTERNAL FIXATION (ORIF) ANKLE FRACTURE (Right) Up with therapy Will mobilize today and if OK then D/C home tomorrow.discusseed with patient. Ragina Fenter ANDREW 02/01/2013, 10:13 AM

## 2013-02-01 NOTE — Op Note (Signed)
NAMEMarland Kitchen  Sandra Wagner, Sandra Wagner NO.:  [id number]  MEDICAL RECORD NO.:  [id number]  LOCATION:  [id number]                        FACILITY:  [facility]  PHYSICIAN:  Vania Rea. Marvin Maenza, M.D.  DATE OF BIRTH:  November 29, 1950  DATE OF PROCEDURE:  01/30/2013 DATE OF DISCHARGE:                              OPERATIVE REPORT   PREOPERATIVE DIAGNOSIS:  Closed right ankle bimalleolar fracture dislocation.  POSTOPERATIVE DIAGNOSIS:  Closed right ankle bimalleolar fracture dislocation.  PROCEDURE:  Closed reduction under IV sedation.  SURGEON:  Vania Rea. Blaise Grieshaber, M.D.  ANESTHESIA:  IV sedation.  [name] is a 62 year old female who earlier this evening, tripped and fell at home when she knocked over by her dog sustaining a right ankle fracture dislocation.  Brought to the [facility] Emergency Room by EMS. On evaluation, he was found to have obvious deformity with valgus alignment external rotation of the right foot, but was neurovascularly intact by clinical exam.  X-rays did show displaced bimalleolar ankle fracture dislocation.  Plan is for closed reduction, to be followed by ORIF in the morning.  I have counseled her regarding treatment options.  Risks versus benefits prior to the manipulation.  He understands and accepts and agrees with our plan.  PROCEDURE IN DETAIL:  Utilizing IV sedation, patient was achieved proper relaxation and a gentle reduction maneuver was performed with internal rotation and inversion of the ankle and palpable and visible reduction was achieved.  A very well-padded short-leg stirrup splint was then applied.  Continue with brisk capillary refill.  Good digital motion and sensation post reduction.  The patient was subsequently admitted and transferred to [facility].  Plan is for ORIF in the morning.     Vania Rea. Mahdi Frye, M.D.     KMS/MEDQ  D:  01/31/2013  T:  02/01/2013  Job:  [id number]

## 2013-02-02 NOTE — Progress Notes (Signed)
Physical Therapy Treatment Patient Details Name: KEATON STIREWALT MRN: [id number] DOB: 03-08-1951 Today's Date: 02/02/2013 Time: 4098-1191 PT Time Calculation (min): 24 min  PT Assessment / Plan / Recommendation Comments on Treatment Session  Pt progressing with PT goals & mobility.  Plans are for d/c after PT session.  Practiced stairs again this session-- pt reports increased ease.      Follow Up Recommendations  No PT follow up     Does the patient have the potential to tolerate intense rehabilitation     Barriers to Discharge        Equipment Recommendations  Rolling walker with 5" wheels    Recommendations for Other Services    Frequency Min 6X/week   Plan Discharge plan remains appropriate    Precautions / Restrictions Precautions Precautions: Fall Restrictions RLE Weight Bearing: Non weight bearing   Pertinent Vitals/Pain 4/10 R LE    Mobility  Bed Mobility Bed Mobility: Supine to Sit;Sitting - Scoot to Edge of Bed Supine to Sit: 6: Modified independent (Device/Increase time) Sitting - Scoot to Edge of Bed: 6: Modified independent (Device/Increase time) Transfers Transfers: Sit to Stand;Stand to Sit Sit to Stand: 5: Supervision;With upper extremity assist;With armrests;From bed;From chair/3-in-1 Stand to Sit: 5: Supervision;With upper extremity assist;With armrests;To chair/3-in-1 Details for Transfer Assistance: Cues for safest hand placement Ambulation/Gait Ambulation/Gait Assistance: 4: Min guard Ambulation Distance (Feet): 30 Feet Assistive device: Rolling walker Ambulation/Gait Assistance Details: Guarding for safety.  Cues to ensure balance between hops Gait Pattern: Step-to pattern Stairs: Yes Stairs Assistance: 4: Min guard Stairs Assistance Details (indicate cue type and reason): Pt reports husband put up 2nd rail.  Performed bacwards with bil rails.  Pt reports increased ease compared to yesterday's stair training but still difficult due to soreness in  UE's.  Stair Management Technique: Two rails;Step to pattern;Backwards Number of Stairs: 2 Wheelchair Mobility Wheelchair Mobility: No      PT Goals Acute Rehab PT Goals Time For Goal Achievement: 02/08/13 Potential to Achieve Goals: Good Pt will Ambulate: 16 - 50 feet;with modified independence;with rolling walker PT Goal: Ambulate - Progress: Progressing toward goal Pt will Go Up / Down Stairs: 1-2 stairs;with min assist;with rolling walker PT Goal: Up/Down Stairs - Progress: Met  Visit Information  Last PT Received On: 02/02/13 Assistance Needed: +1    Subjective Data      Cognition  Cognition Overall Cognitive Status: Appears within functional limits for tasks assessed/performed Arousal/Alertness: Awake/alert Orientation Level: Appears intact for tasks assessed Behavior During Session: [facility] for tasks performed    Balance  Balance Balance Assessed: No  End of Session PT - End of Session Equipment Utilized During Treatment: Gait belt Activity Tolerance: Patient tolerated treatment well Patient left: in chair;with call bell/phone within reach Nurse Communication: Mobility status     Verdell Face, [place] [phone] 02/02/2013

## 2013-02-02 NOTE — Progress Notes (Signed)
Subjective: 2 Days Post-Op Procedure(s) (LRB): OPEN REDUCTION INTERNAL FIXATION (ORIF) ANKLE FRACTURE (Right) Patient reports pain as mild.  Pain under control, improved from yesterday. Feels she did well with PT yesterday, notes steps were difficult. No other c/o  Objective: Vital signs in last 24 hours: Temp:  [98.1 F (36.7 C)-99.3 F (37.4 C)] 98.1 F (36.7 C) (04/06 0617) Pulse Rate:  [56-80] 56 (04/06 0617) Resp:  [18] 18 (04/06 0617) BP: (105-120)/(51-57) 117/53 mmHg (04/06 0617) SpO2:  [95 %-97 %] 95 % (04/06 0617)  Intake/Output from previous day: 04/05 0701 - 04/06 0700 In: 1120 [P.O.:1120] Out: -  Intake/Output this shift:     Recent Labs  01/30/13 2300 01/31/13 1656  HGB 12.8 13.0    Recent Labs  01/30/13 2300 01/31/13 1656  WBC 8.8 11.3*  RBC 4.04 4.08  HCT 37.8 38.3  PLT 201 190    Recent Labs  01/30/13 2300 01/31/13 1656  NA 139  --   K 4.0  --   CL 106  --   CO2 28  --   BUN 17  --   CREATININE 0.72 0.63  GLUCOSE 132*  --   CALCIUM 8.7  --     Recent Labs  01/30/13 2300  INR 0.99    Neurologically intact Neurovascular intact Sensation intact distally Intact pulses distally Dorsiflexion/Plantar flexion intact Incision: dressing C/D/I and no drainage No cellulitis present Compartment soft No calf or knee pain, no sign of DVT  Assessment/Plan: 2 Days Post-Op Procedure(s) (LRB): OPEN REDUCTION INTERNAL FIXATION (ORIF) ANKLE FRACTURE (Right) Advance diet Up with therapy  NWB RLE D/C today after AM PT session Reviewed D/C instructions   BISSELL, JACLYN M. 02/02/2013, 7:51 AM

## 2013-02-02 NOTE — Progress Notes (Signed)
   CARE MANAGEMENT NOTE 02/02/2013  Patient:  Sandra Wagner, Sandra Wagner   Account Number:  [id number]  Date Initiated:  02/02/2013  Documentation initiated by:  [facility]  Subjective/Objective Assessment:   OPEN REDUCTION INTERNAL FIXATION (ORIF) ANKLE FRACTURE (Right)     Action/Plan:   Anticipated DC Date:  02/03/2013   Anticipated DC Plan:  HOME W HOME HEALTH SERVICES      DC Planning Services  CM consult      [facility] Choice  HOME HEALTH   Choice offered to / List presented to:  C-1 Patient   DME arranged  3-N-1  Levan Hurst      DME agency  [facility]        Status of service:  Completed, signed off Medicare Important Message given?   (If response is "NO", the following Medicare IM given date fields will be blank) Date Medicare IM given:   Date Additional Medicare IM given:    Discharge Disposition:  HOME/SELF CARE  Per UR Regulation:    If discussed at Long Length of Stay Meetings, dates discussed:    Comments:  No HH PT recommended.  Isidoro Donning RN CCM Case Mgmt phone [phone]

## 2013-02-03 ENCOUNTER — Encounter (HOSPITAL_COMMUNITY): Payer: Self-pay | Admitting: Orthopedic Surgery

## 2013-02-04 NOTE — Discharge Summary (Signed)
Physician Discharge Summary   Patient ID: Sandra Wagner MRN: [id number] DOB/AGE: 1951-02-09 62 y.o.  Admit date: 01/30/2013 Discharge date: 02/04/2013  Primary Diagnosis:   Ankle fracture, right, closed, initial encounter [824.8]  Admission Diagnoses:  Past Medical History  Diagnosis Date  . Hypertension   . Anxiety    Discharge Diagnoses:   Active Problems:   * No active hospital problems. *  Procedure:  Procedure(s) (LRB): OPEN REDUCTION INTERNAL FIXATION (ORIF) ANKLE FRACTURE (Right)   Consults: None  HPI:  see H&P    Laboratory Data:  No results found for this basename: HGB,  in the last 72 hours No results found for this basename: WBC, RBC, HCT, PLT,  in the last 72 hours No results found for this basename: NA, K, CL, CO2, BUN, CREATININE, GLUCOSE, CALCIUM,  in the last 72 hours No results found for this basename: LABPT, INR,  in the last 72 hours  X-Rays:Dg Ankle Complete Right  01/31/2013  *RADIOLOGY REPORT*  Clinical Data: ORIF right ankle fracture  RIGHT ANKLE - COMPLETE 3+ VIEW  Comparison: Plain films 01/30/2013  Findings: Dynamic plate fixation of the distal fibular fracture with seven cortical screws.  Cannulated screw fixation of the malleolar fracture with two screws.  Ankle mortise is intact.  IMPRESSION: ORIF right ankle fracture with without complication.   Original Report Authenticated By: Genevive Bi, M.D.    Dg Ankle Complete Right  01/30/2013  *RADIOLOGY REPORT*  Clinical Data: Right ankle pain and deformity after fall.  RIGHT ANKLE - COMPLETE 3+ VIEW  Comparison: None.  Findings: Trimalleolar fractures of the right ankle with probable anterior malleolar avulsion.  There is an oblique fracture of the distal fibular shaft with superior and lateral displacement and overriding of the distal fracture fragment.  There is a transverse fracture of the medial malleolus extending to the joint surface with lateral displacement of the distal fracture fragment.   There is a small avulsion fracture at the tip of the medial malleolus. The talus is displaced posteriorly and laterally with respect to the tibia.  Coronal posterior malleolar fracture with mild posterior displacement.  Soft tissue swelling.  IMPRESSION: Trimalleolar fractures of the right ankle with lateral dislocation of the talus with respect to the tibia and lateral displacement of distal medial and lateral malleolar fragments.   Original Report Authenticated By: Burman Nieves, M.D.    Dg Foot Complete Right  01/30/2013  *RADIOLOGY REPORT*  Clinical Data: Right ankle pain and deformity after fall.  RIGHT FOOT COMPLETE - 3+ VIEW  Comparison: None.  Findings: Right ankle fracture dislocation is noted.  See additional report of the right ankle today.  There is evidence of cortical irregularity and slight fragmentation of the lateral aspect of the first metatarsal head.  This suggest nondisplaced fracture or avulsion.  There may be a tiny intra-articular component.  There is overlying soft tissue swelling.  No additional fractures are demonstrated in the right foot.  No radiopaque soft tissue foreign bodies.  No focal bone lesion or bone destruction.  IMPRESSION: Cortical fracture of the medial aspect of the first metatarsal head with possible tiny intra-articular component.   Original Report Authenticated By: Burman Nieves, M.D.     EKG: Orders placed during the hospital encounter of 01/30/13  . EKG 12-LEAD  . EKG 12-LEAD  . EKG     Hospital Course: Patient was admitted to [facility] and taken to the OR and underwent the above stated procedure without complications.  Patient  tolerated the procedure well and was later transferred to the recovery room and then to the orthopaedic floor for postoperative care.  They were given PO and IV analgesics for pain control following their surgery.  They were given 24 hours of postoperative antibiotics.   PT was consulted postop to assist with mobility  and transfers.  The patient was NWB with therapy and was taught transfers and ambulation with assistance. Discharge planning was consulted to help with postop disposition and equipment needs.  Patient had a good night on the evening of surgery and started to get up OOB with therapy on day one, but had difficulty with steps. Patient was seen in rounds and was ready to go home on day two.  They were given discharge instructions and dressing directions.  They were instructed on when to follow up in the office with Dr. Rennis Chris.  Discharge Medications: Prior to Admission medications   Medication Sig Start Date End Date Taking? Authorizing Provider  ALPRAZolam Prudy Feeler) 0.5 MG tablet Take 0.5 mg by mouth at bedtime.   Yes Historical Provider, MD  ibuprofen (ADVIL,MOTRIN) 200 MG tablet Take 400 mg by mouth every 8 (eight) hours as needed for pain.   Yes Historical Provider, MD  metoprolol succinate (TOPROL-XL) 50 MG 24 hr tablet Take 50 mg by mouth daily. Take with or immediately following a meal.   Yes Historical Provider, MD  PARoxetine (PAXIL) 40 MG tablet Take 40 mg by mouth at bedtime.   Yes Historical Provider, MD  aspirin EC 81 MG tablet Take 1 tablet (81 mg total) by mouth daily. 01/31/13 03/02/13  French Ana Shuford, PA-C  methocarbamol (ROBAXIN) 500 MG tablet Take 1 tablet (500 mg total) by mouth 3 (three) times daily as needed. 01/31/13   French Ana Shuford, PA-C  oxyCODONE-acetaminophen (PERCOCET) 5-325 MG per tablet Take 1-2 tablets by mouth every 4 (four) hours as needed for pain. 01/31/13   Ralene Bathe, PA-C    Diet: low sodium heart healthy Activity:NWB Follow-up:in 10-14 days Disposition - Home Discharged Condition: good   Discharge Orders   Future Orders Complete By Expires     Call MD / Call 911  As directed     Comments:      If you experience chest pain or shortness of breath, CALL 911 and be transported to the hospital emergency room.  If you develope a fever above 101 F, pus (white drainage) or  increased drainage or redness at the wound, or calf pain, call your surgeon's office.    Constipation Prevention  As directed     Comments:      Drink plenty of fluids.  Prune juice may be helpful.  You may use a stool softener, such as Colace (over the counter) 100 mg twice a day.  Use MiraLax (over the counter) for constipation as needed.    Diet - low sodium heart healthy  As directed     Increase activity slowly as tolerated  As directed         Medication List    TAKE these medications       ALPRAZolam 0.5 MG tablet  Commonly known as:  XANAX  Take 0.5 mg by mouth at bedtime.     aspirin EC 81 MG tablet  Take 1 tablet (81 mg total) by mouth daily.     ibuprofen 200 MG tablet  Commonly known as:  ADVIL,MOTRIN  Take 400 mg by mouth every 8 (eight) hours as needed for pain.  methocarbamol 500 MG tablet  Commonly known as:  ROBAXIN  Take 1 tablet (500 mg total) by mouth 3 (three) times daily as needed.     metoprolol succinate 50 MG 24 hr tablet  Commonly known as:  TOPROL-XL  Take 50 mg by mouth daily. Take with or immediately following a meal.     oxyCODONE-acetaminophen 5-325 MG per tablet  Commonly known as:  PERCOCET  Take 1-2 tablets by mouth every 4 (four) hours as needed for pain.     PARoxetine 40 MG tablet  Commonly known as:  PAXIL  Take 40 mg by mouth at bedtime.           Follow-up Information   Follow up with SUPPLE,KEVIN M, MD. (call to be seen in 10-14 days)    Contact information:   [address], [address] [place] [address] [phone]       Signed: Dorothy Spark. for Dr. Rennis Chris 02/04/2013, 1:24 PM

## 2013-02-06 NOTE — Anesthesia Postprocedure Evaluation (Signed)
Anesthesia Post Note  Patient: Sandra Wagner  Procedure(s) Performed: Procedure(s) (LRB): OPEN REDUCTION INTERNAL FIXATION (ORIF) ANKLE FRACTURE (Right)  Anesthesia type: general  Patient location: PACU  Post pain: Pain level controlled  Post assessment: Patient's Cardiovascular Status Stable  Last Vitals:  Filed Vitals:   02/02/13 0617  BP: 117/53  Pulse: 56  Temp: 36.7 C  Resp: 18    Post vital signs: Reviewed and stable  Level of consciousness: sedated  Complications: No apparent anesthesia complications

## 2013-11-21 ENCOUNTER — Other Ambulatory Visit: Payer: Self-pay

## 2013-11-21 DIAGNOSIS — Z9889 Other specified postprocedural states: Secondary | ICD-10-CM

## 2013-11-21 DIAGNOSIS — Z1231 Encounter for screening mammogram for malignant neoplasm of breast: Secondary | ICD-10-CM

## 2013-11-21 DIAGNOSIS — Z853 Personal history of malignant neoplasm of breast: Secondary | ICD-10-CM

## 2013-12-04 ENCOUNTER — Ambulatory Visit: Payer: Commercial Managed Care - PPO

## 2013-12-18 ENCOUNTER — Ambulatory Visit: Payer: Commercial Managed Care - PPO

## 2014-01-05 ENCOUNTER — Ambulatory Visit: Payer: Commercial Managed Care - PPO

## 2016-12-14 ENCOUNTER — Other Ambulatory Visit: Payer: Self-pay | Admitting: Endocrinology

## 2016-12-14 DIAGNOSIS — Z1231 Encounter for screening mammogram for malignant neoplasm of breast: Secondary | ICD-10-CM

## 2017-01-03 ENCOUNTER — Ambulatory Visit
Admission: RE | Admit: 2017-01-03 | Discharge: 2017-01-03 | Disposition: A | Discharge status: Home / Self Care | Payer: BLUE CROSS/BLUE SHIELD | Source: Ambulatory Visit | Attending: Endocrinology | Admitting: Endocrinology

## 2017-01-03 DIAGNOSIS — Z1231 Encounter for screening mammogram for malignant neoplasm of breast: Secondary | ICD-10-CM

## 2017-01-03 HISTORY — DX: Malignant (primary) neoplasm, unspecified: C80.1

## 2017-12-18 DIAGNOSIS — H2513 Age-related nuclear cataract, bilateral: Secondary | ICD-10-CM | POA: Diagnosis not present

## 2018-01-02 DIAGNOSIS — H2512 Age-related nuclear cataract, left eye: Secondary | ICD-10-CM | POA: Diagnosis not present

## 2018-01-02 DIAGNOSIS — H2513 Age-related nuclear cataract, bilateral: Secondary | ICD-10-CM | POA: Diagnosis not present

## 2018-02-06 DIAGNOSIS — F418 Other specified anxiety disorders: Secondary | ICD-10-CM | POA: Diagnosis not present

## 2018-02-06 DIAGNOSIS — E559 Vitamin D deficiency, unspecified: Secondary | ICD-10-CM | POA: Diagnosis not present

## 2018-02-06 DIAGNOSIS — R413 Other amnesia: Secondary | ICD-10-CM | POA: Diagnosis not present

## 2018-02-06 DIAGNOSIS — M19072 Primary osteoarthritis, left ankle and foot: Secondary | ICD-10-CM | POA: Diagnosis not present

## 2018-02-06 DIAGNOSIS — Z6832 Body mass index (BMI) 32.0-32.9, adult: Secondary | ICD-10-CM | POA: Diagnosis not present

## 2018-02-06 DIAGNOSIS — E669 Obesity, unspecified: Secondary | ICD-10-CM | POA: Diagnosis not present

## 2018-02-06 DIAGNOSIS — C50919 Malignant neoplasm of unspecified site of unspecified female breast: Secondary | ICD-10-CM | POA: Diagnosis not present

## 2018-02-06 DIAGNOSIS — E038 Other specified hypothyroidism: Secondary | ICD-10-CM | POA: Diagnosis not present

## 2018-02-06 DIAGNOSIS — I10 Essential (primary) hypertension: Secondary | ICD-10-CM | POA: Diagnosis not present

## 2018-02-06 DIAGNOSIS — E7849 Other hyperlipidemia: Secondary | ICD-10-CM | POA: Diagnosis not present

## 2018-03-12 DIAGNOSIS — Z888 Allergy status to other drugs, medicaments and biological substances status: Secondary | ICD-10-CM | POA: Diagnosis not present

## 2018-03-12 DIAGNOSIS — Z853 Personal history of malignant neoplasm of breast: Secondary | ICD-10-CM | POA: Diagnosis not present

## 2018-03-12 DIAGNOSIS — Z79899 Other long term (current) drug therapy: Secondary | ICD-10-CM | POA: Diagnosis not present

## 2018-03-12 DIAGNOSIS — H2512 Age-related nuclear cataract, left eye: Secondary | ICD-10-CM | POA: Diagnosis not present

## 2018-03-12 DIAGNOSIS — Z91013 Allergy to seafood: Secondary | ICD-10-CM | POA: Diagnosis not present

## 2018-03-12 DIAGNOSIS — I10 Essential (primary) hypertension: Secondary | ICD-10-CM | POA: Diagnosis not present

## 2018-03-12 DIAGNOSIS — Z87891 Personal history of nicotine dependence: Secondary | ICD-10-CM | POA: Diagnosis not present

## 2018-03-12 DIAGNOSIS — F419 Anxiety disorder, unspecified: Secondary | ICD-10-CM | POA: Diagnosis not present

## 2018-03-13 DIAGNOSIS — H2511 Age-related nuclear cataract, right eye: Secondary | ICD-10-CM | POA: Diagnosis not present

## 2018-03-26 DIAGNOSIS — Z79899 Other long term (current) drug therapy: Secondary | ICD-10-CM | POA: Diagnosis not present

## 2018-03-26 DIAGNOSIS — Z91013 Allergy to seafood: Secondary | ICD-10-CM | POA: Diagnosis not present

## 2018-03-26 DIAGNOSIS — Z9102 Food additives allergy status: Secondary | ICD-10-CM | POA: Diagnosis not present

## 2018-03-26 DIAGNOSIS — Z888 Allergy status to other drugs, medicaments and biological substances status: Secondary | ICD-10-CM | POA: Diagnosis not present

## 2018-03-26 DIAGNOSIS — Z8249 Family history of ischemic heart disease and other diseases of the circulatory system: Secondary | ICD-10-CM | POA: Diagnosis not present

## 2018-03-26 DIAGNOSIS — R011 Cardiac murmur, unspecified: Secondary | ICD-10-CM | POA: Diagnosis not present

## 2018-03-26 DIAGNOSIS — Z853 Personal history of malignant neoplasm of breast: Secondary | ICD-10-CM | POA: Diagnosis not present

## 2018-03-26 DIAGNOSIS — Z803 Family history of malignant neoplasm of breast: Secondary | ICD-10-CM | POA: Diagnosis not present

## 2018-03-26 DIAGNOSIS — F419 Anxiety disorder, unspecified: Secondary | ICD-10-CM | POA: Diagnosis not present

## 2018-03-26 DIAGNOSIS — I10 Essential (primary) hypertension: Secondary | ICD-10-CM | POA: Diagnosis not present

## 2018-03-26 DIAGNOSIS — H2511 Age-related nuclear cataract, right eye: Secondary | ICD-10-CM | POA: Diagnosis not present

## 2018-03-26 DIAGNOSIS — Z87891 Personal history of nicotine dependence: Secondary | ICD-10-CM | POA: Diagnosis not present

## 2018-03-26 DIAGNOSIS — M1990 Unspecified osteoarthritis, unspecified site: Secondary | ICD-10-CM | POA: Diagnosis not present

## 2018-04-15 ENCOUNTER — Encounter: Payer: Self-pay | Admitting: Neurology

## 2018-04-16 ENCOUNTER — Ambulatory Visit (INDEPENDENT_AMBULATORY_CARE_PROVIDER_SITE_OTHER): Payer: Medicare Other | Admitting: Neurology

## 2018-04-16 ENCOUNTER — Telehealth: Payer: Self-pay | Admitting: Neurology

## 2018-04-16 ENCOUNTER — Encounter: Payer: Self-pay | Admitting: Neurology

## 2018-04-16 VITALS — BP 127/76 | HR 51 | Ht 64.0 in | Wt 187.0 lb

## 2018-04-16 DIAGNOSIS — R41840 Attention and concentration deficit: Secondary | ICD-10-CM | POA: Diagnosis not present

## 2018-04-16 DIAGNOSIS — G3184 Mild cognitive impairment, so stated: Secondary | ICD-10-CM | POA: Diagnosis not present

## 2018-04-16 DIAGNOSIS — R29818 Other symptoms and signs involving the nervous system: Secondary | ICD-10-CM

## 2018-04-16 NOTE — Progress Notes (Signed)
[facility]   Provider:  Larey Seat, M D  Primary Care Physician:  Reynold Bowen, MD   Referring Provider: Reynold Bowen, MD   Chief Complaint  Patient presents with  . New Patient (Initial Visit)    pt alone, rm 10. pt has seen Dr Brett Fairy in the past for questionable silent migraines. pt comes today for some memory difficulties. pt states that she has trouble remembering dates, trouble remembering to do daily things, finds that she makes list. pt states that she retired in 2016 and she said this may be related to the fact she hasnt been in a routine.     HPI:  Sandra Wagner is a 67 y.o. female patient , seen here as in a referral  from Dr. Forde Dandy . 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 She feels still - less frequently - temporary vision impaired.   She is seen here to today for a memory evaluation, she states she has noted cognitive changes since age 64 , around the time of her retirement. She worked as a Radiation protection practitioner , was always on the road, gained weight, slept poorly. Her marriage failed at that time, her stepchildren were problematic - both reportedly addicts.  She is now living with another partner.  Chief complaint according to patient : In the meantime she has noted some cognitive decline, she partially attributed this to no longer having to know the exact date, not longer living on a tight schedule as she did when she worked.  But she has also noted that she has trouble spelling at times which is new and since her 2011 MRI of the brain showed some microvascular changes she wonders if there may be a progression that could explain but she experiences.  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  She is a former smoker with a history of hypertension, history of anxiety stress-induced, obesity stress eating eating disorder.  History of breast cancer  and ankle fracture, chest pain was evaluated in 2010 at hospital cardiac enzymes were negative this all took place before I even saw her.  She did have an open reduction and internal fixation of an ankle fracture left ankle 2015, and this year had cataract surgery.  She reports a lot of difficulties sleeping, on Paxil  ( 10 mg generic) and Xanax ( 1 mg daily, HS). MSM supplement  for ankle fracture pain.  She goes to bed between 9 and 9.30 and reads in a book with pages for about 1 hour. She has no longer  frequent nocturia, (3 times down to one ) since taking-MSM at night , sleeps through for 5-6 hours. She reportedly snores, but mildly since she lost weight.  She sleeps in a cool, quiet an dark bedroom, she sleeps on her site, and sometimes supine, on one pillow.   Averages rise time is 7 AM, spontaneously.    Review of Systems: Out of a complete 14 system review, the patient complains of only the following symptoms, and all other reviewed systems are negative.  Cognitive decline. Subjective/  Epworth score N?A   , Fatigue severity score N/A  , geriatric depression score 4/ 15    Social History   Socioeconomic History  . Marital status: Married    Spouse name: Not on file  . Number of children: Not on file  .  Years of education: Not on file  . Highest education level: Not on file  Occupational History  . Not on file  Social Needs  . Financial resource strain: Not on file  . Food insecurity:    Worry: Not on file    Inability: Not on file  . Transportation needs:    Medical: Not on file    Non-medical: Not on file  Tobacco Use  . Smoking status: Former Smoker    Last attempt to quit: 1990    Years since quitting: 29.4  . Smokeless tobacco: Never Used  Substance and Sexual Activity  . Alcohol use: Yes    Comment: occasional  . Drug use: No  . Sexual activity: Not on file  Lifestyle  . Physical activity:    Days per week: Not on file    Minutes per session: Not on file    . Stress: Not on file  Relationships  . Social connections:    Talks on phone: Not on file    Gets together: Not on file    Attends religious service: Not on file    Active member of club or organization: Not on file    Attends meetings of clubs or organizations: Not on file    Relationship status: Not on file  . Intimate partner violence:    Fear of current or ex partner: Not on file    Emotionally abused: Not on file    Physically abused: Not on file    Forced sexual activity: Not on file  Other Topics Concern  . Not on file  Social History Narrative  . Not on file    Family History  Problem Relation Age of Onset  . Breast cancer Maternal Grandmother     Past Medical History:  Diagnosis Date  . Anxiety   . Anxiety   . [place] ([place])    breast ca, 1998, stage 1  . Depression   . Hypertension     Past Surgical History:  Procedure Laterality Date  . BREAST LUMPECTOMY Left 1998  . BREAST LUMPECTOMY  1998  . ORIF ANKLE FRACTURE Right 01/31/2013   Procedure: OPEN REDUCTION INTERNAL FIXATION (ORIF) ANKLE FRACTURE;  Surgeon: Marin Shutter, MD;  Location: [facility];  Service: Orthopedics;  Laterality: Right;    Current Outpatient Medications  Medication Sig Dispense Refill  . acyclovir (ZOVIRAX) 200 MG capsule Take 400 mg by mouth daily.     Marland Kitchen ALPRAZolam (XANAX) 1 MG tablet Take 1 mg by mouth at bedtime.     . B Complex-C (B-COMPLEX WITH VITAMIN C) tablet Take 1 tablet by mouth daily.    . Betaine, Trimethylglycine, (TMG, TRIMETHYLGLYCINE,) 500 MG CAPS Take 1 capsule by mouth daily.    . Cholecalciferol (D3 VITAMIN PO) Take 1,000 Units by mouth daily.    . cyanocobalamin 1000 MCG tablet Take by mouth daily.     . metoprolol succinate (TOPROL-XL) 50 MG 24 hr tablet Take 50 mg by mouth daily. Take with or immediately following a meal.    . OVER THE COUNTER MEDICATION 100 mg daily. phosphotydiserine    . OVER THE COUNTER MEDICATION 1 mL daily. Lions mane mushroon tonic    .  PARoxetine (PAXIL) 10 MG tablet Take 10 mg by mouth at bedtime.     . saccharomyces boulardii ([employer] DIGESTIVE PROBIOTIC) 250 MG capsule Take 250 mg by mouth every other day.      No current facility-administered medications for this visit.  Allergies as of 04/16/2018 - Review Complete 04/16/2018  Allergen Reaction Noted  . Taxol [paclitaxel] Anaphylaxis 01/30/2013  . Shellfish allergy Hives 01/30/2013  . Turmeric Rash 04/16/2018    Vitals: BP 127/76   Pulse (!) 51   Ht 5\' 4"  (1.626 m)   Wt 187 lb (84.8 kg)   BMI 32.10 kg/m  Last Weight:  Wt Readings from Last 1 Encounters:  04/16/18 187 lb (84.8 kg)   GMW:NUUV mass index is 32.1 kg/m.     Last Height:   Ht Readings from Last 1 Encounters:  04/16/18 5\' 4"  (1.626 m)    Physical exam:  General: The patient is awake, alert and appears not in acute distress. The patient is well groomed. Head: Normocephalic, atraumatic. Neck is supple. Mallampati 3,  neck circumference:14. 5 '  Nasal airflow patent , Cardiovascular:  Regular rate and rhythm , without  murmurs or carotid bruit, and without distended neck veins. Respiratory: Lungs are clear to auscultation. Skin:   evidence of  Ankle edema,knee swelling on the right, ankle bruising on the left.  Trunk: BMI is 32. The patient's posture is erect   Neurologic exam : The patient is awake and alert, oriented to place and time.   Memory subjective described as impaired .Memory testing revealed   Jefferson Davis: Montreal Cognitive Assessment  04/16/2018 04/16/2018  Visuospatial/ Executive (0/5) 4 5  Naming (0/3) 3 -  Attention: Read list of digits (0/2) 2 -  Attention: Read list of letters (0/1) 1 -  Attention: Serial 7 subtraction starting at 100 (0/3) 3 -  Language: Repeat phrase (0/2) 2 -  Language : Fluency (0/1) 1 -  Abstraction (0/2) 2 -  Delayed Recall (0/5) 3 -  Orientation (0/6) 6 6  Total 27 -    Attention span & concentration ability appears normal. Speech is fluent,   without dysarthria, dysphonia or aphasia. Mood and affect are appropriate.  Cranial nerves: Pupils are equal and briskly reactive to light. Funduscopic exam -status post cataract surgery. Extraocular movements  in vertical and horizontal planes intact and without nystagmus. Visual fields by finger perimetry are intact. Hearing to finger rub intact.  Facial sensation intact to fine touch. Facial motor strength is symmetric and tongue and uvula move midline. Shoulder shrug was symmetrical.  Motor exam:   Normal tone, muscle bulk and symmetric strength in all extremities. Her left ankle is swollen, hurts and looks bruised.  Sensory:  Fine touch, pinprick and vibration were tested in all extremities. Proprioception tested in the upper extremities was normal. Coordination:  Finger-to-nose maneuver  normal without evidence of ataxia, dysmetria or tremor. Gait and station: Patient walks without assistive device. Turns with 4 Steps.  Deep tendon reflexes: in the  upper  extremities are symmetric and intact. Babinski maneuver deferred.   Assessment:  After physical and neurologic examination, review of laboratory studies,  Personal review of imaging studies, reports of other /same  Imaging studies, results of polysomnography and / or neurophysiology testing and pre-existing records as far as provided in visit., my assessment is :    1) Mild Cognitive Impairment. MOCA score 27/30 is considered normal , but her anecdotal evidence of spelling difficulties, date and time confusion speaks for more serious impairment.   2) Has already improved sleep quality- helped with daytime alertness. Reduction in non headache migraine auras when sleeping well.    The patient was advised of the nature of the diagnosed disorder, the treatment options and the  risks for general  health and wellness arising from not treating the condition.   I spent more than 60  minutes of face to face time with the patient.  Greater than 50%  of time was spent in counseling and coordination of care. We have discussed the diagnosis and differential and I answered the patient's questions.    Plan:  Treatment plan and additional workup :   MRI brain, repeat.  Yearly Rv with MOCA/ MMSE and with Np.      Larey Seat, MD 0/17/4944, 9:67 PM  Certified in Neurology by ABPN Certified in [place] by [facility] [address], [place], [place] [address]

## 2018-04-16 NOTE — Patient Instructions (Signed)
Mild Neurocognitive Disorder Mild neurocognitive disorder (formerly known as mild cognitive impairment) is a mental disorder. It is a slight abnormal decrease in mental function. The areas of mental function affected may include memory, thought, communication, behavior, and completion of tasks. The decrease is noticeable and measurable but for the most part does not interfere with your daily activities. Mild neurocognitive disorder typically occurs in people older than 60 years but can occur earlier. It is not as serious as major neurocognitive disorder (formerly known as dementia) but may lead to a more serious neurocognitive disorder. However, in some cases the condition does not get worse. A few people with this disorder even improve. What are the causes? There are a number of different causes of mild neurocognitive disorder:  Brain disorders associated with abnormal protein deposits, such as Alzheimer's disease, Pick's disease, and Lewy body disease.  Brain disorders associated with abnormal movement, such as Parkinson's disease and Huntington's disease.  Diseases affecting blood vessels in the brain and resulting in mini-strokes.  Certain infections, such as human immunodeficiency virus (HIV) infection.  Traumatic brain injury.  Other medical conditions such as brain tumors, underactive thyroid (hypothyroidism), and vitamin B12 deficiency.  Use of certain prescription medicine and "recreational" drugs.  What are the signs or symptoms? Symptoms of mild neurocognitive disorder include:  Difficulty remembering. You may forget details of recent events, names, or phone numbers. You may forget important social events and appointments or repeatedly forget where you put your car keys.  Difficulty thinking and solving problems. You may have trouble with complex tasks such as paying bills or driving in unfamiliar locations.  Difficulty communicating. You may have trouble finding the right word,  naming an object, forming a sentence that makes sense, or understanding what you read or hear.  Changes in your behavior or personality. You may lose interest in the things that you used to enjoy or withdraw from social situations. You may get angry more easily than usual. You may act before thinking. You may do things in public that you would not usually do. You may hear or see things that are not real (hallucinations). You may believe falsely that others are trying to hurt you (paranoia).  How is this diagnosed? Mild neurocognitive disorder is diagnosed through an assessment by your health care provider. Your health care provider will ask you and your family, friends, or coworkers questions about your symptoms. He or she will ask how often the symptoms occur, how long they have been occurring, whether they are getting worse, and the effect they are having on your life. Your health care provider may refer you to a neurologist or mental health specialist for a detailed evaluation of your mental functions (neuropsychological testing). To identify the cause of your mild neurocognitive disorder, your health care provider may:  Obtain a detailed medical history.  Ask about alcohol and drug use, including prescription medicine.  Perform a physical exam.  Order blood tests and brain imaging exams.  How is this treated? Mild neurocognitive disorder caused by infections, use of certain medicines or "recreational" drugs, and certain medical conditions may improve with treatment of the condition that is causing the disorder. Mild neurocognitive disorder resulting from other causes generally does not improve and may worsen. In these cases, the goal of treatment is to slow progression of the disorder and help you cope with the loss of mental function. Treatments in these cases include:  Medicine. Medicine helps mainly with memory loss and behavioral symptoms.  Talk therapy.   Talk therapy provides education,  emotional support, memory aids, and other ways of making up for decreases in mental function.  Lifestyle changes. These include regular exercise, a healthy diet (including essential omega-3 fatty acids), intellectual stimulation, and increased social interaction.  This information is not intended to replace advice given to you by your health care provider. Make sure you discuss any questions you have with your health care provider. Document Released: 06/18/2013 Document Revised: 03/23/2016 Document Reviewed: 03/10/2013 Elsevier Interactive Patient Education  2017 Elsevier Inc.  

## 2018-04-16 NOTE — Telephone Encounter (Signed)
Medicare/[place] order sent to [facility]. No auth they will reach out to the pt to schedule.

## 2018-04-23 ENCOUNTER — Ambulatory Visit
Admission: RE | Admit: 2018-04-23 | Discharge: 2018-04-23 | Disposition: A | Discharge status: Home / Self Care | Payer: Medicare Other | Source: Ambulatory Visit | Attending: Neurology | Admitting: Neurology

## 2018-04-23 DIAGNOSIS — R29818 Other symptoms and signs involving the nervous system: Secondary | ICD-10-CM

## 2018-04-23 DIAGNOSIS — R29898 Other symptoms and signs involving the musculoskeletal system: Secondary | ICD-10-CM | POA: Diagnosis not present

## 2018-04-24 ENCOUNTER — Telehealth: Payer: Self-pay

## 2018-04-24 NOTE — Telephone Encounter (Signed)
-----   Message from Lester Montezuma, RN sent at 04/24/2018  5:06 PM EDT -----   ----- Message ----- From: Melvenia Beam, MD Sent: 04/24/2018   4:31 PM To: Gildardo Griffes, RN  MRI of the brain unremarkable for age no significant findings and is stable.

## 2018-04-24 NOTE — Telephone Encounter (Signed)
I called pt, advised her that Dr. Jaynee Eagles reviewed pt's MRI in Dr. Edwena Felty absence, found that it was age appropriate and there was no significant findings, and is stable. Pt is asking for Dr. Brett Fairy to review the MRI has well and call her back if there are any other recommendations. Pt is aware that Dr. Brett Fairy is out of the office for a few weeks. Pt declined a f/u at this time but will call us back to schedule if needed. Pt verbalized understanding of results.

## 2018-05-16 ENCOUNTER — Telehealth: Payer: Self-pay | Admitting: Neurology

## 2018-05-16 NOTE — Telephone Encounter (Signed)
I reviewed the MRI brain image series and agree with dr. Leta Baptist- This is a healthy looking brain without evidence of MS, strokes, and age appropriate appearance of brain volume.  Larey Seat, MD

## 2018-05-20 NOTE — Telephone Encounter (Signed)
Spoke with pt and advised her that Dr.Dohmeier reviewed her MRI and agrees that it shows a healthy looking brain without evidence of MS, strokes, and age appropriate appearance of brain volume. Pt was very appreciative and had no questions.

## 2018-07-17 DIAGNOSIS — H00025 Hordeolum internum left lower eyelid: Secondary | ICD-10-CM | POA: Diagnosis not present

## 2018-08-06 DIAGNOSIS — Z23 Encounter for immunization: Secondary | ICD-10-CM | POA: Diagnosis not present

## 2018-08-06 DIAGNOSIS — I10 Essential (primary) hypertension: Secondary | ICD-10-CM | POA: Diagnosis not present

## 2018-08-06 DIAGNOSIS — E559 Vitamin D deficiency, unspecified: Secondary | ICD-10-CM | POA: Diagnosis not present

## 2018-08-06 DIAGNOSIS — Z6834 Body mass index (BMI) 34.0-34.9, adult: Secondary | ICD-10-CM | POA: Diagnosis not present

## 2018-08-06 DIAGNOSIS — E7849 Other hyperlipidemia: Secondary | ICD-10-CM | POA: Diagnosis not present

## 2018-08-06 DIAGNOSIS — M19072 Primary osteoarthritis, left ankle and foot: Secondary | ICD-10-CM | POA: Diagnosis not present

## 2018-08-06 DIAGNOSIS — E038 Other specified hypothyroidism: Secondary | ICD-10-CM | POA: Diagnosis not present

## 2018-08-06 DIAGNOSIS — R413 Other amnesia: Secondary | ICD-10-CM | POA: Diagnosis not present

## 2018-08-06 DIAGNOSIS — F418 Other specified anxiety disorders: Secondary | ICD-10-CM | POA: Diagnosis not present

## 2018-08-06 DIAGNOSIS — C50919 Malignant neoplasm of unspecified site of unspecified female breast: Secondary | ICD-10-CM | POA: Diagnosis not present

## 2018-08-06 DIAGNOSIS — M79606 Pain in leg, unspecified: Secondary | ICD-10-CM | POA: Diagnosis not present

## 2019-06-12 ENCOUNTER — Other Ambulatory Visit: Payer: Self-pay | Admitting: Endocrinology

## 2019-06-12 ENCOUNTER — Other Ambulatory Visit: Payer: Self-pay | Admitting: Community Based"

## 2019-06-12 DIAGNOSIS — Z1231 Encounter for screening mammogram for malignant neoplasm of breast: Secondary | ICD-10-CM

## 2019-07-25 ENCOUNTER — Ambulatory Visit
Admission: RE | Admit: 2019-07-25 | Discharge: 2019-07-25 | Disposition: A | Discharge status: Home / Self Care | Payer: Medicare Other | Source: Ambulatory Visit | Attending: Endocrinology | Admitting: Endocrinology

## 2019-07-25 ENCOUNTER — Other Ambulatory Visit: Payer: Self-pay

## 2019-07-25 DIAGNOSIS — E038 Other specified hypothyroidism: Secondary | ICD-10-CM | POA: Diagnosis not present

## 2019-07-25 DIAGNOSIS — E7849 Other hyperlipidemia: Secondary | ICD-10-CM | POA: Diagnosis not present

## 2019-07-25 DIAGNOSIS — Z0000 Encounter for general adult medical examination without abnormal findings: Secondary | ICD-10-CM | POA: Diagnosis not present

## 2019-07-25 DIAGNOSIS — Z1231 Encounter for screening mammogram for malignant neoplasm of breast: Secondary | ICD-10-CM

## 2019-07-25 DIAGNOSIS — R82998 Other abnormal findings in urine: Secondary | ICD-10-CM | POA: Diagnosis not present

## 2019-07-25 DIAGNOSIS — I10 Essential (primary) hypertension: Secondary | ICD-10-CM | POA: Diagnosis not present

## 2019-07-25 DIAGNOSIS — E559 Vitamin D deficiency, unspecified: Secondary | ICD-10-CM | POA: Diagnosis not present

## 2019-07-25 HISTORY — DX: Malignant neoplasm of unspecified site of unspecified female breast: C50.919

## 2019-07-29 DIAGNOSIS — E785 Hyperlipidemia, unspecified: Secondary | ICD-10-CM | POA: Diagnosis not present

## 2019-07-29 DIAGNOSIS — Z0000 Encounter for general adult medical examination without abnormal findings: Secondary | ICD-10-CM | POA: Diagnosis not present

## 2019-07-29 DIAGNOSIS — N3281 Overactive bladder: Secondary | ICD-10-CM | POA: Diagnosis not present

## 2019-07-29 DIAGNOSIS — M19072 Primary osteoarthritis, left ankle and foot: Secondary | ICD-10-CM | POA: Diagnosis not present

## 2019-07-29 DIAGNOSIS — E559 Vitamin D deficiency, unspecified: Secondary | ICD-10-CM | POA: Diagnosis not present

## 2019-07-29 DIAGNOSIS — C50919 Malignant neoplasm of unspecified site of unspecified female breast: Secondary | ICD-10-CM | POA: Diagnosis not present

## 2019-07-29 DIAGNOSIS — F418 Other specified anxiety disorders: Secondary | ICD-10-CM | POA: Diagnosis not present

## 2019-07-29 DIAGNOSIS — I10 Essential (primary) hypertension: Secondary | ICD-10-CM | POA: Diagnosis not present

## 2019-07-29 DIAGNOSIS — R413 Other amnesia: Secondary | ICD-10-CM | POA: Diagnosis not present

## 2019-07-29 DIAGNOSIS — E039 Hypothyroidism, unspecified: Secondary | ICD-10-CM | POA: Diagnosis not present

## 2019-07-29 DIAGNOSIS — E669 Obesity, unspecified: Secondary | ICD-10-CM | POA: Diagnosis not present

## 2019-08-14 DIAGNOSIS — D230 Other benign neoplasm of skin of lip: Secondary | ICD-10-CM | POA: Diagnosis not present

## 2019-08-14 DIAGNOSIS — R238 Other skin changes: Secondary | ICD-10-CM | POA: Diagnosis not present

## 2019-08-14 DIAGNOSIS — C4401 Basal cell carcinoma of skin of lip: Secondary | ICD-10-CM | POA: Diagnosis not present

## 2019-09-03 DIAGNOSIS — M25512 Pain in left shoulder: Secondary | ICD-10-CM | POA: Diagnosis not present

## 2019-09-03 DIAGNOSIS — M7542 Impingement syndrome of left shoulder: Secondary | ICD-10-CM | POA: Diagnosis not present

## 2019-09-17 DIAGNOSIS — M899 Disorder of bone, unspecified: Secondary | ICD-10-CM | POA: Diagnosis not present

## 2019-09-17 DIAGNOSIS — M25512 Pain in left shoulder: Secondary | ICD-10-CM | POA: Diagnosis not present

## 2019-09-17 DIAGNOSIS — G8929 Other chronic pain: Secondary | ICD-10-CM | POA: Diagnosis not present

## 2019-09-17 DIAGNOSIS — R29898 Other symptoms and signs involving the musculoskeletal system: Secondary | ICD-10-CM | POA: Diagnosis not present

## 2019-09-17 DIAGNOSIS — M25571 Pain in right ankle and joints of right foot: Secondary | ICD-10-CM | POA: Diagnosis not present

## 2019-09-29 DIAGNOSIS — M899 Disorder of bone, unspecified: Secondary | ICD-10-CM | POA: Diagnosis not present

## 2019-09-29 DIAGNOSIS — G8929 Other chronic pain: Secondary | ICD-10-CM | POA: Diagnosis not present

## 2019-09-29 DIAGNOSIS — M25512 Pain in left shoulder: Secondary | ICD-10-CM | POA: Diagnosis not present

## 2019-09-29 DIAGNOSIS — R29898 Other symptoms and signs involving the musculoskeletal system: Secondary | ICD-10-CM | POA: Diagnosis not present

## 2019-09-29 DIAGNOSIS — M19012 Primary osteoarthritis, left shoulder: Secondary | ICD-10-CM | POA: Diagnosis not present

## 2019-09-29 DIAGNOSIS — M75112 Incomplete rotator cuff tear or rupture of left shoulder, not specified as traumatic: Secondary | ICD-10-CM | POA: Diagnosis not present

## 2019-10-03 DIAGNOSIS — C4401 Basal cell carcinoma of skin of lip: Secondary | ICD-10-CM | POA: Diagnosis not present

## 2019-10-03 DIAGNOSIS — R238 Other skin changes: Secondary | ICD-10-CM | POA: Diagnosis not present

## 2019-10-09 DIAGNOSIS — G8929 Other chronic pain: Secondary | ICD-10-CM | POA: Diagnosis not present

## 2019-10-09 DIAGNOSIS — M25571 Pain in right ankle and joints of right foot: Secondary | ICD-10-CM | POA: Diagnosis not present

## 2019-10-15 DIAGNOSIS — Z1212 Encounter for screening for malignant neoplasm of rectum: Secondary | ICD-10-CM | POA: Diagnosis not present

## 2019-12-25 DIAGNOSIS — C4401 Basal cell carcinoma of skin of lip: Secondary | ICD-10-CM | POA: Diagnosis not present

## 2019-12-26 DIAGNOSIS — Z23 Encounter for immunization: Secondary | ICD-10-CM | POA: Diagnosis not present

## 2020-01-16 DIAGNOSIS — Z23 Encounter for immunization: Secondary | ICD-10-CM | POA: Diagnosis not present

## 2020-01-27 DIAGNOSIS — R413 Other amnesia: Secondary | ICD-10-CM | POA: Diagnosis not present

## 2020-01-27 DIAGNOSIS — E038 Other specified hypothyroidism: Secondary | ICD-10-CM | POA: Diagnosis not present

## 2020-01-27 DIAGNOSIS — M25512 Pain in left shoulder: Secondary | ICD-10-CM | POA: Diagnosis not present

## 2020-01-27 DIAGNOSIS — E669 Obesity, unspecified: Secondary | ICD-10-CM | POA: Diagnosis not present

## 2020-01-27 DIAGNOSIS — E559 Vitamin D deficiency, unspecified: Secondary | ICD-10-CM | POA: Diagnosis not present

## 2020-01-27 DIAGNOSIS — R040 Epistaxis: Secondary | ICD-10-CM | POA: Diagnosis not present

## 2020-01-27 DIAGNOSIS — I10 Essential (primary) hypertension: Secondary | ICD-10-CM | POA: Diagnosis not present

## 2020-01-27 DIAGNOSIS — C50919 Malignant neoplasm of unspecified site of unspecified female breast: Secondary | ICD-10-CM | POA: Diagnosis not present

## 2020-01-27 DIAGNOSIS — F418 Other specified anxiety disorders: Secondary | ICD-10-CM | POA: Diagnosis not present

## 2020-01-27 DIAGNOSIS — E7849 Other hyperlipidemia: Secondary | ICD-10-CM | POA: Diagnosis not present

## 2020-02-06 IMAGING — MG MM DIGITAL SCREENING BILAT W/ TOMO W/ CAD
8 series · 8 of 24 positions shown · non-contrast
Comparison: Previous exam(s).

CLINICAL DATA: Screening.

EXAM:
DIGITAL SCREENING BILATERAL MAMMOGRAM WITH TOMO AND CAD

[L MLO synth-2D]
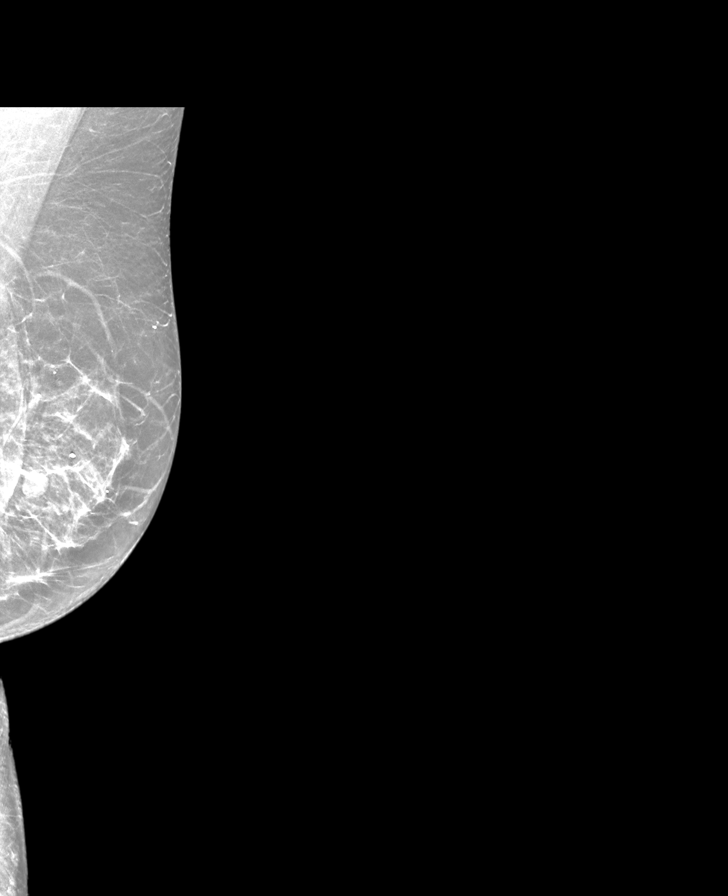

[R MLO synth-2D]
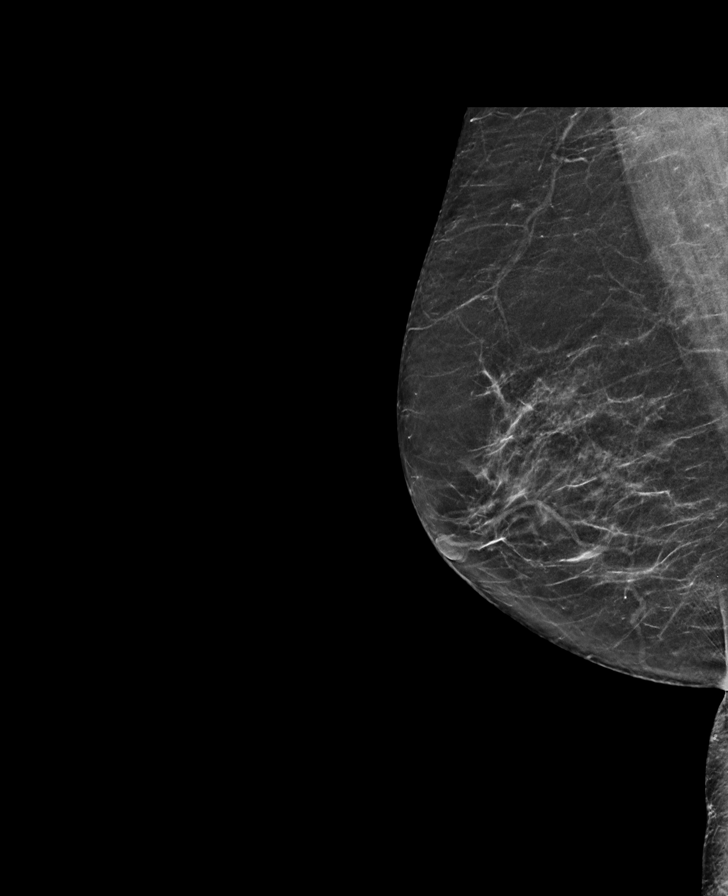

[R CC synth-2D]
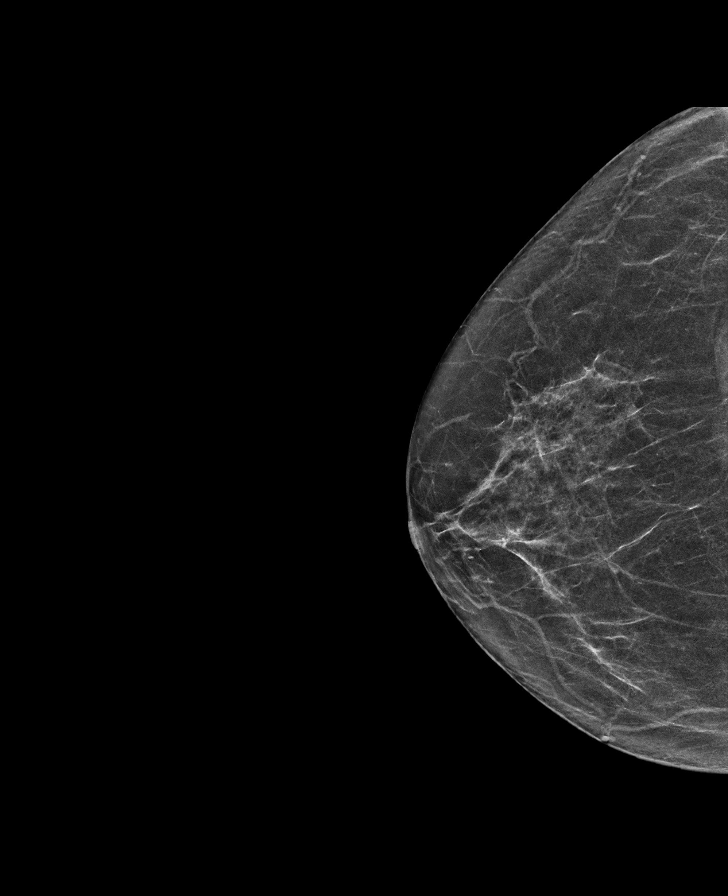

[L CC synth-2D]
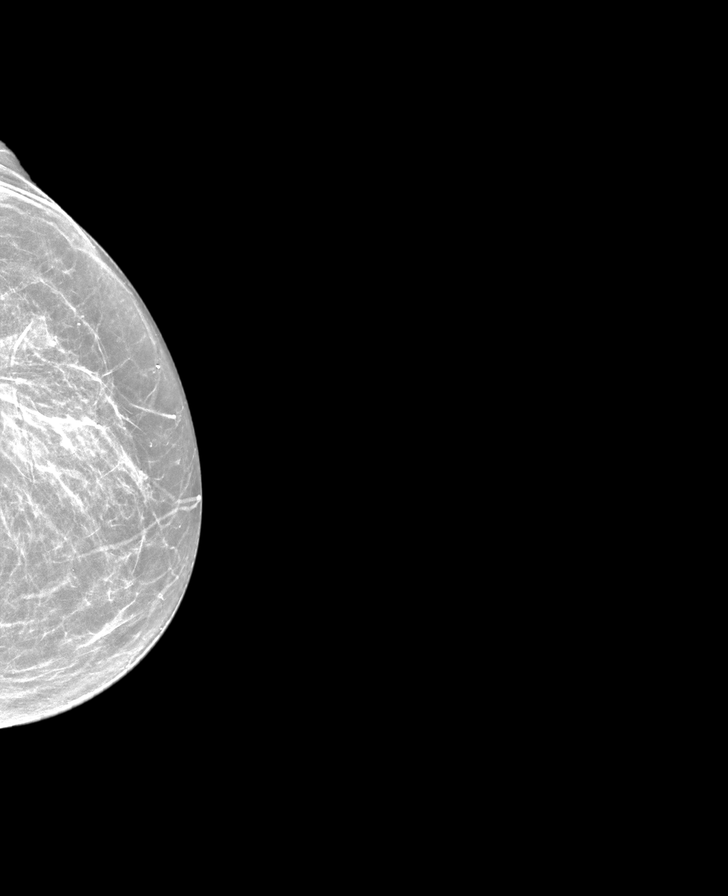

[L MLO tomo · tomo slice 29/57.0]
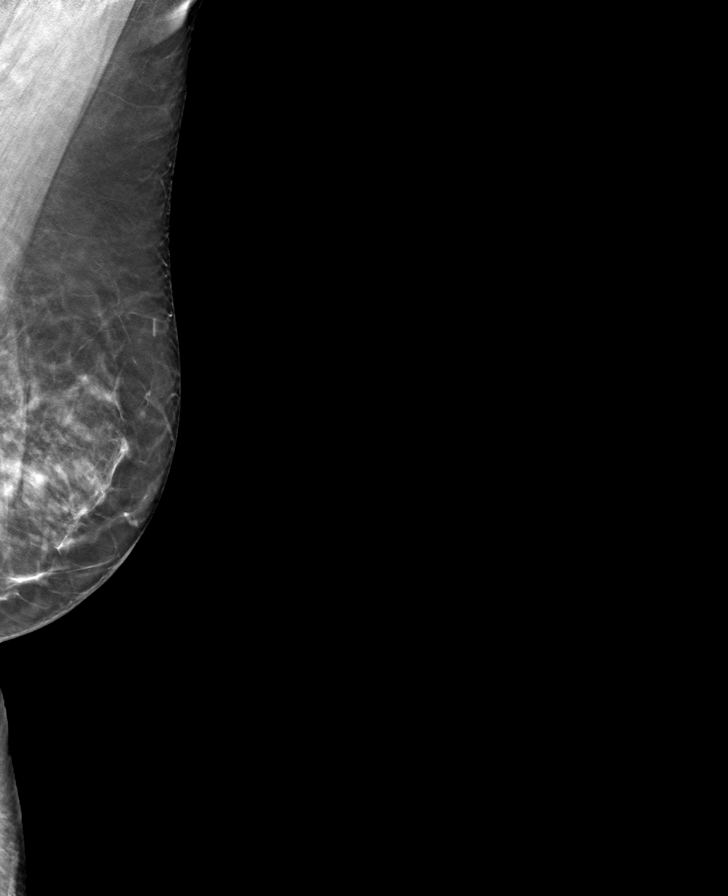

[R MLO tomo · tomo slice 31/60.0]
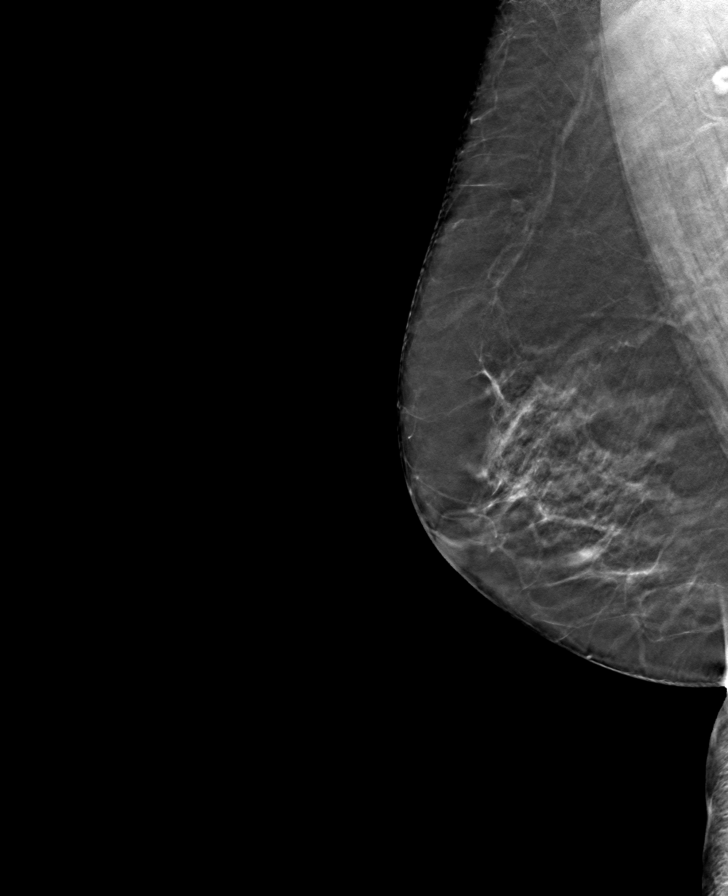

[R CC tomo · tomo slice 31/60.0]
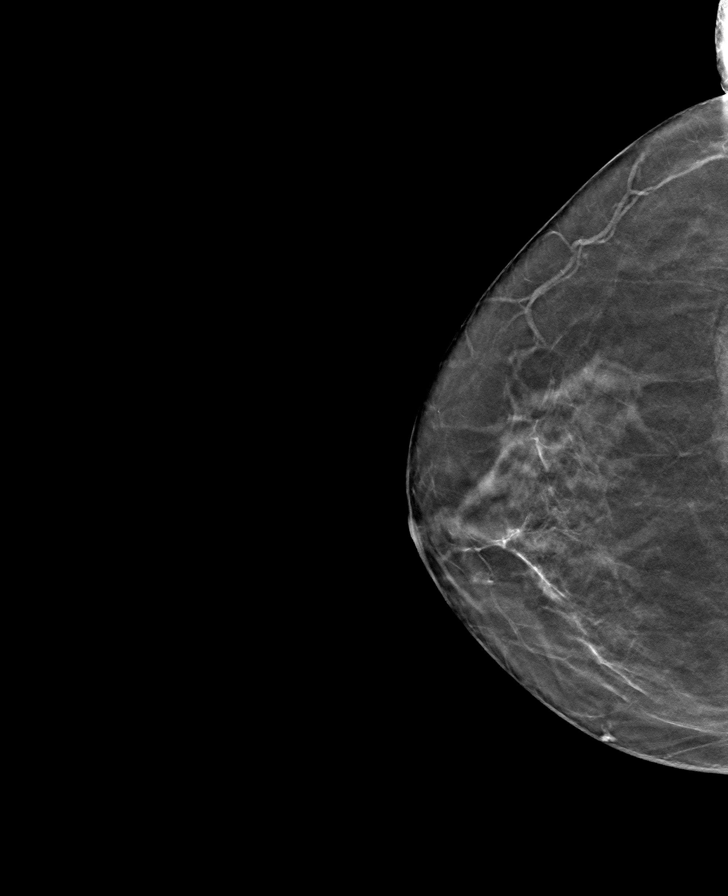

[L CC tomo · tomo slice 28/55.0]
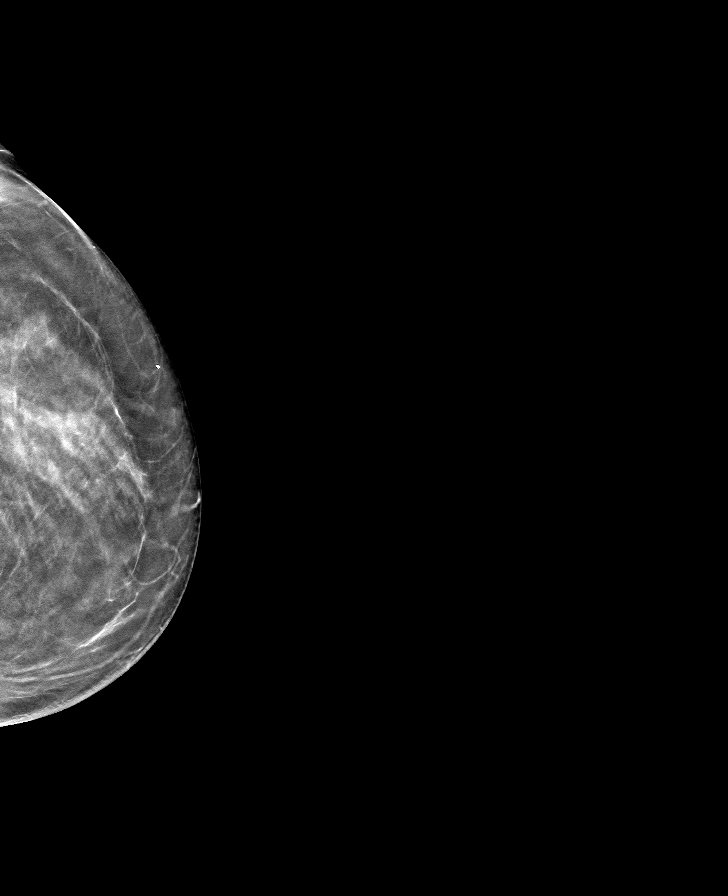

[8 of 24 positions shown; findings below may reference images not displayed]

ACR Breast Density Category b: There are scattered areas of
fibroglandular density.
FINDINGS: There are no findings suspicious for malignancy. Images were
processed with CAD.
IMPRESSION: No mammographic evidence of malignancy. A result letter of this
screening mammogram will be mailed directly to the patient.

RECOMMENDATION:
Screening mammogram in one year. (Code:[id number])

BI-RADS CATEGORY  1: Negative.

## 2020-02-23 DIAGNOSIS — M75112 Incomplete rotator cuff tear or rupture of left shoulder, not specified as traumatic: Secondary | ICD-10-CM | POA: Diagnosis not present

## 2020-02-23 DIAGNOSIS — G8929 Other chronic pain: Secondary | ICD-10-CM | POA: Diagnosis not present

## 2020-02-23 DIAGNOSIS — M25612 Stiffness of left shoulder, not elsewhere classified: Secondary | ICD-10-CM | POA: Diagnosis not present

## 2020-02-23 DIAGNOSIS — M7502 Adhesive capsulitis of left shoulder: Secondary | ICD-10-CM | POA: Diagnosis not present

## 2020-02-23 DIAGNOSIS — M25512 Pain in left shoulder: Secondary | ICD-10-CM | POA: Diagnosis not present

## 2020-02-24 DIAGNOSIS — R040 Epistaxis: Secondary | ICD-10-CM | POA: Diagnosis not present

## 2020-03-09 DIAGNOSIS — H26492 Other secondary cataract, left eye: Secondary | ICD-10-CM | POA: Diagnosis not present

## 2020-03-09 DIAGNOSIS — H43813 Vitreous degeneration, bilateral: Secondary | ICD-10-CM | POA: Diagnosis not present

## 2020-03-09 DIAGNOSIS — Z961 Presence of intraocular lens: Secondary | ICD-10-CM | POA: Diagnosis not present

## 2020-10-13 DIAGNOSIS — E785 Hyperlipidemia, unspecified: Secondary | ICD-10-CM | POA: Diagnosis not present

## 2020-10-13 DIAGNOSIS — E039 Hypothyroidism, unspecified: Secondary | ICD-10-CM | POA: Diagnosis not present

## 2020-10-13 DIAGNOSIS — E559 Vitamin D deficiency, unspecified: Secondary | ICD-10-CM | POA: Diagnosis not present

## 2020-10-13 DIAGNOSIS — H9319 Tinnitus, unspecified ear: Secondary | ICD-10-CM | POA: Diagnosis not present

## 2020-10-13 DIAGNOSIS — I10 Essential (primary) hypertension: Secondary | ICD-10-CM | POA: Diagnosis not present

## 2020-10-13 DIAGNOSIS — E669 Obesity, unspecified: Secondary | ICD-10-CM | POA: Diagnosis not present

## 2020-10-13 DIAGNOSIS — F418 Other specified anxiety disorders: Secondary | ICD-10-CM | POA: Diagnosis not present

## 2020-11-05 DIAGNOSIS — Z23 Encounter for immunization: Secondary | ICD-10-CM | POA: Diagnosis not present

## 2021-01-24 DIAGNOSIS — H43813 Vitreous degeneration, bilateral: Secondary | ICD-10-CM | POA: Diagnosis not present

## 2021-01-24 DIAGNOSIS — H26492 Other secondary cataract, left eye: Secondary | ICD-10-CM | POA: Diagnosis not present

## 2021-01-24 DIAGNOSIS — H04123 Dry eye syndrome of bilateral lacrimal glands: Secondary | ICD-10-CM | POA: Diagnosis not present

## 2021-03-07 DIAGNOSIS — Z8781 Personal history of (healed) traumatic fracture: Secondary | ICD-10-CM | POA: Diagnosis not present

## 2021-03-07 DIAGNOSIS — M25571 Pain in right ankle and joints of right foot: Secondary | ICD-10-CM | POA: Diagnosis not present

## 2021-03-07 DIAGNOSIS — S93401A Sprain of unspecified ligament of right ankle, initial encounter: Secondary | ICD-10-CM | POA: Diagnosis not present

## 2021-03-07 DIAGNOSIS — S86111A Strain of other muscle(s) and tendon(s) of posterior muscle group at lower leg level, right leg, initial encounter: Secondary | ICD-10-CM | POA: Diagnosis not present

## 2021-03-30 DIAGNOSIS — I519 Heart disease, unspecified: Secondary | ICD-10-CM | POA: Diagnosis not present

## 2021-03-30 DIAGNOSIS — E559 Vitamin D deficiency, unspecified: Secondary | ICD-10-CM | POA: Diagnosis not present

## 2021-03-30 DIAGNOSIS — E039 Hypothyroidism, unspecified: Secondary | ICD-10-CM | POA: Diagnosis not present

## 2021-04-11 DIAGNOSIS — Z0000 Encounter for general adult medical examination without abnormal findings: Secondary | ICD-10-CM | POA: Diagnosis not present

## 2021-04-11 DIAGNOSIS — Z1339 Encounter for screening examination for other mental health and behavioral disorders: Secondary | ICD-10-CM | POA: Diagnosis not present

## 2021-04-11 DIAGNOSIS — F418 Other specified anxiety disorders: Secondary | ICD-10-CM | POA: Diagnosis not present

## 2021-04-11 DIAGNOSIS — I10 Essential (primary) hypertension: Secondary | ICD-10-CM | POA: Diagnosis not present

## 2021-04-11 DIAGNOSIS — E669 Obesity, unspecified: Secondary | ICD-10-CM | POA: Diagnosis not present

## 2021-04-11 DIAGNOSIS — Z1331 Encounter for screening for depression: Secondary | ICD-10-CM | POA: Diagnosis not present

## 2021-04-11 DIAGNOSIS — E559 Vitamin D deficiency, unspecified: Secondary | ICD-10-CM | POA: Diagnosis not present

## 2021-04-11 DIAGNOSIS — H9319 Tinnitus, unspecified ear: Secondary | ICD-10-CM | POA: Diagnosis not present

## 2021-04-11 DIAGNOSIS — E039 Hypothyroidism, unspecified: Secondary | ICD-10-CM | POA: Diagnosis not present

## 2021-04-11 DIAGNOSIS — C50919 Malignant neoplasm of unspecified site of unspecified female breast: Secondary | ICD-10-CM | POA: Diagnosis not present

## 2021-04-11 DIAGNOSIS — E785 Hyperlipidemia, unspecified: Secondary | ICD-10-CM | POA: Diagnosis not present

## 2021-04-13 DIAGNOSIS — Z1231 Encounter for screening mammogram for malignant neoplasm of breast: Secondary | ICD-10-CM | POA: Diagnosis not present

## 2021-05-11 DIAGNOSIS — Z1211 Encounter for screening for malignant neoplasm of colon: Secondary | ICD-10-CM | POA: Diagnosis not present

## 2021-05-11 DIAGNOSIS — Z1212 Encounter for screening for malignant neoplasm of rectum: Secondary | ICD-10-CM | POA: Diagnosis not present

## 2021-05-25 DIAGNOSIS — Z8781 Personal history of (healed) traumatic fracture: Secondary | ICD-10-CM | POA: Diagnosis not present

## 2021-05-25 DIAGNOSIS — T8484XA Pain due to internal orthopedic prosthetic devices, implants and grafts, initial encounter: Secondary | ICD-10-CM | POA: Diagnosis not present

## 2021-05-25 DIAGNOSIS — Z969 Presence of functional implant, unspecified: Secondary | ICD-10-CM | POA: Diagnosis not present

## 2021-08-04 DIAGNOSIS — Z969 Presence of functional implant, unspecified: Secondary | ICD-10-CM | POA: Diagnosis not present

## 2021-08-04 DIAGNOSIS — Z8781 Personal history of (healed) traumatic fracture: Secondary | ICD-10-CM | POA: Diagnosis not present

## 2021-08-04 DIAGNOSIS — M7750 Other enthesopathy of unspecified foot: Secondary | ICD-10-CM | POA: Diagnosis not present

## 2021-08-04 DIAGNOSIS — T8484XA Pain due to internal orthopedic prosthetic devices, implants and grafts, initial encounter: Secondary | ICD-10-CM | POA: Diagnosis not present

## 2021-10-11 DIAGNOSIS — M79605 Pain in left leg: Secondary | ICD-10-CM | POA: Diagnosis not present

## 2021-10-13 DIAGNOSIS — F419 Anxiety disorder, unspecified: Secondary | ICD-10-CM | POA: Diagnosis not present

## 2021-10-13 DIAGNOSIS — Z888 Allergy status to other drugs, medicaments and biological substances status: Secondary | ICD-10-CM | POA: Diagnosis not present

## 2021-10-13 DIAGNOSIS — M7751 Other enthesopathy of right foot: Secondary | ICD-10-CM | POA: Diagnosis not present

## 2021-10-13 DIAGNOSIS — Z91013 Allergy to seafood: Secondary | ICD-10-CM | POA: Diagnosis not present

## 2021-10-13 DIAGNOSIS — I10 Essential (primary) hypertension: Secondary | ICD-10-CM | POA: Diagnosis not present

## 2021-10-13 DIAGNOSIS — Z853 Personal history of malignant neoplasm of breast: Secondary | ICD-10-CM | POA: Diagnosis not present

## 2021-10-13 DIAGNOSIS — Z803 Family history of malignant neoplasm of breast: Secondary | ICD-10-CM | POA: Diagnosis not present

## 2021-10-13 DIAGNOSIS — S82892S Other fracture of left lower leg, sequela: Secondary | ICD-10-CM | POA: Diagnosis not present

## 2021-10-13 DIAGNOSIS — T8484XA Pain due to internal orthopedic prosthetic devices, implants and grafts, initial encounter: Secondary | ICD-10-CM | POA: Diagnosis not present

## 2021-10-13 DIAGNOSIS — Z8249 Family history of ischemic heart disease and other diseases of the circulatory system: Secondary | ICD-10-CM | POA: Diagnosis not present

## 2021-10-13 DIAGNOSIS — Z87891 Personal history of nicotine dependence: Secondary | ICD-10-CM | POA: Diagnosis not present

## 2021-10-13 DIAGNOSIS — E669 Obesity, unspecified: Secondary | ICD-10-CM | POA: Diagnosis not present

## 2021-10-13 DIAGNOSIS — Z79899 Other long term (current) drug therapy: Secondary | ICD-10-CM | POA: Diagnosis not present

## 2021-10-13 DIAGNOSIS — M7752 Other enthesopathy of left foot: Secondary | ICD-10-CM | POA: Diagnosis not present

## 2021-11-01 DIAGNOSIS — Z4789 Encounter for other orthopedic aftercare: Secondary | ICD-10-CM | POA: Diagnosis not present

## 2021-11-10 DIAGNOSIS — T8484XA Pain due to internal orthopedic prosthetic devices, implants and grafts, initial encounter: Secondary | ICD-10-CM | POA: Diagnosis not present

## 2021-12-03 DIAGNOSIS — Z20822 Contact with and (suspected) exposure to covid-19: Secondary | ICD-10-CM | POA: Diagnosis not present

## 2022-02-20 DIAGNOSIS — Z20822 Contact with and (suspected) exposure to covid-19: Secondary | ICD-10-CM | POA: Diagnosis not present

## 2022-03-07 DIAGNOSIS — Z20822 Contact with and (suspected) exposure to covid-19: Secondary | ICD-10-CM | POA: Diagnosis not present

## 2022-03-30 ENCOUNTER — Other Ambulatory Visit: Payer: Self-pay | Admitting: Endocrinology

## 2022-03-30 DIAGNOSIS — Z1231 Encounter for screening mammogram for malignant neoplasm of breast: Secondary | ICD-10-CM

## 2022-04-20 DIAGNOSIS — Z1231 Encounter for screening mammogram for malignant neoplasm of breast: Secondary | ICD-10-CM | POA: Diagnosis not present

## 2022-05-04 DIAGNOSIS — I10 Essential (primary) hypertension: Secondary | ICD-10-CM | POA: Diagnosis not present

## 2022-05-04 DIAGNOSIS — Z853 Personal history of malignant neoplasm of breast: Secondary | ICD-10-CM | POA: Diagnosis not present

## 2022-05-04 DIAGNOSIS — F418 Other specified anxiety disorders: Secondary | ICD-10-CM | POA: Diagnosis not present

## 2022-05-04 DIAGNOSIS — E669 Obesity, unspecified: Secondary | ICD-10-CM | POA: Diagnosis not present

## 2022-05-04 DIAGNOSIS — R82998 Other abnormal findings in urine: Secondary | ICD-10-CM | POA: Diagnosis not present

## 2022-05-04 DIAGNOSIS — E559 Vitamin D deficiency, unspecified: Secondary | ICD-10-CM | POA: Diagnosis not present

## 2022-05-04 DIAGNOSIS — H9319 Tinnitus, unspecified ear: Secondary | ICD-10-CM | POA: Diagnosis not present

## 2022-05-04 DIAGNOSIS — Z0000 Encounter for general adult medical examination without abnormal findings: Secondary | ICD-10-CM | POA: Diagnosis not present

## 2022-05-04 DIAGNOSIS — Z1331 Encounter for screening for depression: Secondary | ICD-10-CM | POA: Diagnosis not present

## 2022-05-04 DIAGNOSIS — E785 Hyperlipidemia, unspecified: Secondary | ICD-10-CM | POA: Diagnosis not present

## 2022-05-04 DIAGNOSIS — Z1339 Encounter for screening examination for other mental health and behavioral disorders: Secondary | ICD-10-CM | POA: Diagnosis not present

## 2022-05-04 DIAGNOSIS — E039 Hypothyroidism, unspecified: Secondary | ICD-10-CM | POA: Diagnosis not present

## 2022-11-29 DIAGNOSIS — M545 Low back pain, unspecified: Secondary | ICD-10-CM | POA: Diagnosis not present

## 2022-11-29 DIAGNOSIS — Z853 Personal history of malignant neoplasm of breast: Secondary | ICD-10-CM | POA: Diagnosis not present

## 2022-11-29 DIAGNOSIS — R109 Unspecified abdominal pain: Secondary | ICD-10-CM | POA: Diagnosis not present

## 2022-11-29 DIAGNOSIS — H9319 Tinnitus, unspecified ear: Secondary | ICD-10-CM | POA: Diagnosis not present

## 2022-11-29 DIAGNOSIS — E785 Hyperlipidemia, unspecified: Secondary | ICD-10-CM | POA: Diagnosis not present

## 2022-11-29 DIAGNOSIS — E669 Obesity, unspecified: Secondary | ICD-10-CM | POA: Diagnosis not present

## 2022-11-29 DIAGNOSIS — E039 Hypothyroidism, unspecified: Secondary | ICD-10-CM | POA: Diagnosis not present

## 2022-11-29 DIAGNOSIS — I10 Essential (primary) hypertension: Secondary | ICD-10-CM | POA: Diagnosis not present

## 2022-11-29 DIAGNOSIS — F418 Other specified anxiety disorders: Secondary | ICD-10-CM | POA: Diagnosis not present

## 2022-11-29 DIAGNOSIS — E559 Vitamin D deficiency, unspecified: Secondary | ICD-10-CM | POA: Diagnosis not present

## 2023-05-08 DIAGNOSIS — E669 Obesity, unspecified: Secondary | ICD-10-CM | POA: Diagnosis not present

## 2023-05-08 DIAGNOSIS — R82998 Other abnormal findings in urine: Secondary | ICD-10-CM | POA: Diagnosis not present

## 2023-05-08 DIAGNOSIS — E039 Hypothyroidism, unspecified: Secondary | ICD-10-CM | POA: Diagnosis not present

## 2023-05-08 DIAGNOSIS — E559 Vitamin D deficiency, unspecified: Secondary | ICD-10-CM | POA: Diagnosis not present

## 2023-05-08 DIAGNOSIS — E785 Hyperlipidemia, unspecified: Secondary | ICD-10-CM | POA: Diagnosis not present

## 2023-05-08 DIAGNOSIS — Z1331 Encounter for screening for depression: Secondary | ICD-10-CM | POA: Diagnosis not present

## 2023-05-08 DIAGNOSIS — Z1339 Encounter for screening examination for other mental health and behavioral disorders: Secondary | ICD-10-CM | POA: Diagnosis not present

## 2023-05-08 DIAGNOSIS — Z0000 Encounter for general adult medical examination without abnormal findings: Secondary | ICD-10-CM | POA: Diagnosis not present

## 2023-05-08 DIAGNOSIS — Z853 Personal history of malignant neoplasm of breast: Secondary | ICD-10-CM | POA: Diagnosis not present

## 2023-05-08 DIAGNOSIS — I10 Essential (primary) hypertension: Secondary | ICD-10-CM | POA: Diagnosis not present

## 2023-05-08 DIAGNOSIS — H9319 Tinnitus, unspecified ear: Secondary | ICD-10-CM | POA: Diagnosis not present

## 2023-05-08 DIAGNOSIS — F418 Other specified anxiety disorders: Secondary | ICD-10-CM | POA: Diagnosis not present

## 2023-05-15 DIAGNOSIS — L209 Atopic dermatitis, unspecified: Secondary | ICD-10-CM | POA: Diagnosis not present

## 2023-06-14 DIAGNOSIS — D485 Neoplasm of uncertain behavior of skin: Secondary | ICD-10-CM | POA: Diagnosis not present

## 2023-06-14 DIAGNOSIS — L209 Atopic dermatitis, unspecified: Secondary | ICD-10-CM | POA: Diagnosis not present

## 2023-06-14 DIAGNOSIS — L309 Dermatitis, unspecified: Secondary | ICD-10-CM | POA: Diagnosis not present

## 2023-07-17 DIAGNOSIS — L300 Nummular dermatitis: Secondary | ICD-10-CM | POA: Diagnosis not present

## 2023-07-17 DIAGNOSIS — L820 Inflamed seborrheic keratosis: Secondary | ICD-10-CM | POA: Diagnosis not present

## 2023-07-25 ENCOUNTER — Ambulatory Visit (INDEPENDENT_AMBULATORY_CARE_PROVIDER_SITE_OTHER): Payer: Medicare Other | Admitting: Urology

## 2023-07-25 ENCOUNTER — Encounter: Payer: Self-pay | Admitting: Urology

## 2023-07-25 VITALS — BP 128/72 | HR 54 | Ht 64.0 in | Wt 185.0 lb

## 2023-07-25 DIAGNOSIS — N3281 Overactive bladder: Secondary | ICD-10-CM | POA: Diagnosis not present

## 2023-07-25 DIAGNOSIS — N390 Urinary tract infection, site not specified: Secondary | ICD-10-CM | POA: Diagnosis not present

## 2023-07-25 DIAGNOSIS — N3946 Mixed incontinence: Secondary | ICD-10-CM | POA: Diagnosis not present

## 2023-07-25 MED ORDER — SOLIFENACIN SUCCINATE 10 MG PO TABS: 10.0000 mg | ORAL_TABLET | Freq: Every day | ORAL | 5 refills | Status: AC

## 2023-07-25 NOTE — Progress Notes (Signed)
Assessment: 1. 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  Of note is that she does have bilateral lower extremity edema and some of her nocturia may be related to fluid mobilization at night.  Not sure whether or not she would benefit from a diuretic.  She will discuss with her PCP.  We discussed medical management options for her OAB symptoms.  She would like to try new medical therapy. Rx: Vesicare 10 mg-patient instructed to take 1/2 tablet daily for the first 7 to 10 days and can titrate up to full tablet if tolerated.  Follow-up in 8 to 10 weeks for recheck with female exam and cystoscopy  Chief Complaint: LUTS  History of Present Illness:  Sandra Wagner is a 72 y.o. female who is seen in consultation from [place], MD for evaluation of LUTS.  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 She reports that over the last year or so she has had increasing lower urinary tract symptoms consisting of frequency urgency and occasional urge incontinence.  She also has significant nocturia.  She reports getting up every 2 hours or so at night.  She previously years ago took oxybutynin without much benefit.  She has also been treated with multiple courses of antibiotics without culture documentation of UTIs.  She has some concern that she may have some prolapse and does have some mild stress urinary incontinence as well.  She does wear 1-2 many pads per day.  Urinalysis today is entirely negative. No history of gross hematuria  Patient does have a history of stage I breast cancer in 1998 status post lumpectomy/RT/chemo.  She has been continuously NED and is no longer followed.    Past Medical History:  Past Medical History:  Diagnosis Date   Anxiety    Anxiety    Breast cancer ([facility])    left   Cancer ([facility])    breast ca, 1998,  stage 1   Depression    Hypertension     Past Surgical History:  Past Surgical History:  Procedure Laterality Date   BREAST LUMPECTOMY Left 1998   BREAST LUMPECTOMY  1998   ORIF ANKLE FRACTURE Right 01/31/2013   Procedure: OPEN REDUCTION INTERNAL FIXATION (ORIF) ANKLE FRACTURE;  Surgeon: Senaida Lange, MD;  Location: [facility];  Service: Orthopedics;  Laterality: Right;    Allergies:  Allergies  Allergen Reactions   Taxol [Paclitaxel] Anaphylaxis   Shellfish Allergy Hives   Turmeric Rash    Family History:  Family History  Problem Relation Age of Onset   Breast cancer Maternal Grandmother     Social History:  Social History   Tobacco Use   Smoking status: Former    Current packs/day: 0.00    Types: Cigarettes    Quit date: 1990    Years since quitting: 34.7   Smokeless tobacco: Never  Substance Use Topics   Alcohol use: Yes    Comment: occasional   Drug use: No    Review of symptoms:  Constitutional:  Negative for unexplained weight loss, night sweats, fever, chills ENT:  Negative for nose bleeds, sinus pain, painful swallowing CV:  Negative for chest pain, shortness of breath, exercise intolerance, palpitations, loss of consciousness Resp:  Negative for cough, wheezing, shortness of breath GI:  Negative for nausea, vomiting, diarrhea, bloody stools GU:  Positives  noted in HPI; otherwise negative for gross hematuria, dysuria, urinary incontinence Neuro:  Negative for seizures, poor balance, limb weakness, slurred speech Psych:  Negative for lack of energy, depression, anxiety Endocrine:  Negative for polydipsia, polyuria, symptoms of hypoglycemia (dizziness, hunger, sweating) Hematologic:  Negative for anemia, purpura, petechia, prolonged or excessive bleeding, use of anticoagulants  Allergic:  Negative for difficulty breathing or choking as a result of exposure to anything; no shellfish allergy; no allergic response (rash/itch) to materials, foods  Physical  exam: BP 128/72   Pulse (!) 54   Ht 5\' 4"  (1.626 m)   Wt 185 lb (83.9 kg)   BMI 31.76 kg/m  GENERAL APPEARANCE:  Well appearing, well developed, well nourished, NAD    Results: UA clear

## 2023-07-30 LAB — URINALYSIS, ROUTINE W REFLEX MICROSCOPIC
Bilirubin, UA: NEGATIVE
Glucose, UA: NEGATIVE
Leukocytes,UA: NEGATIVE
Nitrite, UA: NEGATIVE
Protein,UA: NEGATIVE
RBC, UA: NEGATIVE
Specific Gravity, UA: 1.02 (ref 1.005–1.030)
Urobilinogen, Ur: 0.2 mg/dL (ref 0.2–1.0)
pH, UA: 6 (ref 5.0–7.5)

## 2023-09-06 ENCOUNTER — Other Ambulatory Visit: Payer: Self-pay | Admitting: *Deleted

## 2023-09-06 DIAGNOSIS — M7989 Other specified soft tissue disorders: Secondary | ICD-10-CM

## 2023-09-13 ENCOUNTER — Ambulatory Visit (HOSPITAL_COMMUNITY): Payer: Medicare Other

## 2023-09-18 NOTE — Progress Notes (Unsigned)
Assessment: 1. OAB (overactive bladder)   2. Mixed stress and urge urinary incontinence   3. Atrophic vaginitis   4. Urethral caruncle     Plan: Patient will continue Vesicare 5 mg for her OAB  Given her marked atrophic vaginitis as well as urethral caruncle I have also recommended estrogen vaginal cream. Rx: Estrace cream-rationale as well as proper utilization discussed in detail with patient today.  Chief Complaint: Oab/mixed UI  HPI: Sandra Wagner is a 72 y.o. female who presents for continued evaluation of OAB and mixed urinary incontinence.. 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 See my note 07/25/2023 at the time of initial visit for detailed history. At the time of her initial visit she was started on Vesicare 10 mg daily. She is only using half tab daily and reports dramatic improvement in her lower urinary tract symptoms she is actually quite pleased.  In summary the patiented with an approximately 1 year history of increasing lower urinary tract symptoms-primarily frequency urgency and occasional urge incontinence.  She also has significant nocturia she does have lower extremity edema and gets up at every 2 hours or so at night.  She has been previously treated with oxybutynin without significant benefit.  She has also been treated with multiple antibiotic courses for question UTI without culture documentation.    She has some concern that she may have some prolapse and does have some mild stress urinary incontinence as well.  She does wear 1-2 many pads per day.   Urinalysis at initial visit-entirely negative. No history of gross hematuria   Patient does have a history of stage I breast cancer in 1998 status post lumpectomy/RT/chemo.  She has been continuously NED and is no longer followed.  Portions of the above documentation were copied from a prior visit for review purposes only.  Allergies: Allergies  Allergen Reactions   Taxol  [Paclitaxel] Anaphylaxis   Shellfish Allergy Hives   Turmeric Rash    PMH: Past Medical History:  Diagnosis Date   Anxiety    Anxiety    Breast cancer (HCC)    left   Cancer (HCC)    breast ca, 1998, stage 1   Depression    Hypertension     PSH: Past Surgical History:  Procedure Laterality Date   BREAST LUMPECTOMY Left 1998   BREAST LUMPECTOMY  1998   ORIF ANKLE FRACTURE Right 01/31/2013   Procedure: OPEN REDUCTION INTERNAL FIXATION (ORIF) ANKLE FRACTURE;  Surgeon: Senaida Lange, MD;  Location: [facility] OR;  Service: Orthopedics;  Laterality: Right;    SH: Social History   Tobacco Use   Smoking status: Former    Current packs/day: 0.00    Types: Cigarettes    Quit date: 1990    Years since quitting: 34.9   Smokeless tobacco: Never  Substance Use Topics   Alcohol use: Yes    Comment: occasional   Drug use: No    ROS: Constitutional:  Negative for fever, chills, weight loss CV: Negative for chest pain, previous MI, hypertension Respiratory:  Negative for shortness of breath, wheezing, sleep apnea, frequent cough GI:  Negative for nausea, vomiting, bloody stool, GERD  PE: BP 132/75   Pulse (!) 53   Ht 5\' 4"  (1.626 m)   Wt 185 lb (83.9 kg)   BMI 31.76 kg/m  GENERAL APPEARANCE:  Well appearing, well developed, well nourished, NAD    Results: UA entirely clear   PROCEDURE:  FEMALE CYSTOSCOPY  INDICATION:  LUTS  DESCRIPTION OF PROCEDURE: The patient was brought to the procedure room where she was correctly identified and the procedure was again reviewed and informed consent obtained.  She was then positioned in the modified dorsolithotomy position and her external genitalia were prepped and draped in the usual fashion.  Female exam was subsequently performed.  This revealed marked atrophic vaginitis as well as a small urethral carbuncle.  There is no evidence of significant prolapse.  Flexible cystoscopy was then performed.  On careful inspection of the  bladder there were no focal mucosal abnormalities.  The ureteral openings appeared normal.  Procedure well-tolerated.

## 2023-09-19 ENCOUNTER — Ambulatory Visit: Payer: Medicare Other | Admitting: Urology

## 2023-09-19 VITALS — BP 132/75 | HR 53 | Ht 64.0 in | Wt 185.0 lb

## 2023-09-19 DIAGNOSIS — N3946 Mixed incontinence: Secondary | ICD-10-CM

## 2023-09-19 DIAGNOSIS — N362 Urethral caruncle: Secondary | ICD-10-CM | POA: Diagnosis not present

## 2023-09-19 DIAGNOSIS — N952 Postmenopausal atrophic vaginitis: Secondary | ICD-10-CM

## 2023-09-19 DIAGNOSIS — N3281 Overactive bladder: Secondary | ICD-10-CM

## 2023-09-19 LAB — URINALYSIS, ROUTINE W REFLEX MICROSCOPIC
Bilirubin, UA: NEGATIVE
Glucose, UA: NEGATIVE
Ketones, UA: NEGATIVE
Leukocytes,UA: NEGATIVE
Nitrite, UA: NEGATIVE
Protein,UA: NEGATIVE
RBC, UA: NEGATIVE
Specific Gravity, UA: 1.02 (ref 1.005–1.030)
Urobilinogen, Ur: 0.2 mg/dL (ref 0.2–1.0)
pH, UA: 6 (ref 5.0–7.5)

## 2023-09-19 MED ORDER — ESTRADIOL 0.1 MG/GM VA CREA: TOPICAL_CREAM | VAGINAL | 12 refills | Status: AC

## 2023-10-02 ENCOUNTER — Encounter: Payer: Medicare Other | Admitting: Vascular Surgery

## 2023-11-08 ENCOUNTER — Ambulatory Visit (INDEPENDENT_AMBULATORY_CARE_PROVIDER_SITE_OTHER): Payer: Medicare Other | Admitting: Physician Assistant

## 2023-11-08 ENCOUNTER — Ambulatory Visit (HOSPITAL_COMMUNITY)
Admission: RE | Admit: 2023-11-08 | Discharge: 2023-11-08 | Disposition: A | Discharge status: Home / Self Care | Payer: Medicare Other | Source: Ambulatory Visit | Attending: Vascular Surgery | Admitting: Vascular Surgery

## 2023-11-08 VITALS — BP 138/79 | HR 56 | Temp 98.0°F | Resp 18 | Ht 64.0 in | Wt 187.0 lb

## 2023-11-08 DIAGNOSIS — M7989 Other specified soft tissue disorders: Secondary | ICD-10-CM

## 2023-11-08 NOTE — Progress Notes (Signed)
 [facility]           [facility] [place]  History and Physical   Sandra Wagner is a 73 y.o. female who presents with BLE swelling with right more symptomatic.   She states she has hx of right ankle fracture and recently had the hardware removed.  She states that she has had pain in this leg and thought with removal of the hardware, it would improve.  She has never had hx of DVT and does not have any skin color changes.  She has used compression but she does not wear them regularly.  She does have family hx with her mother with leg swelling and her mother is 52 y/o.  She and her sister take turns helping take care of her since she does still live at home.  She states that when she has been over helping her mother, she does notice increased pain and swelling in her legs.   She does not get cramping with walking but does get cramps at night.  She does get burning and tingling sensations in her feet.  She states she has never had any bleeding from veins in her legs.   The pt is not on a statin for cholesterol management.  The pt is not on a daily aspirin .   Other AC:  none The pt is on BB for hypertension.   The pt is not on medication for diabetes.   Tobacco hx:  former  Pt does not have family hx of AAA.  Past Medical History:  Diagnosis Date   Anxiety    Anxiety    Breast cancer (HCC)    left   Cancer (HCC)    breast ca, 1998, stage 1   Depression    Hypertension     Past Surgical History:  Procedure Laterality Date   BREAST LUMPECTOMY Left 1998   BREAST LUMPECTOMY  1998   ORIF ANKLE FRACTURE Right 01/31/2013   Procedure: OPEN REDUCTION INTERNAL FIXATION (ORIF) ANKLE FRACTURE;  Surgeon: Franky CHRISTELLA Pointer, MD;  Location: [facility] OR;  Service: Orthopedics;  Laterality: Right;    Social History   Socioeconomic History   Marital status: Media Planner    Spouse name: Not on file   Number of children: Not on file   Years of education: Not on file   Highest  education level: Not on file  Occupational History   Not on file  Tobacco Use   Smoking status: Former    Current packs/day: 0.00    Types: Cigarettes    Quit date: 1990    Years since quitting: 35.0   Smokeless tobacco: Never  Substance and Sexual Activity   Alcohol  use: Yes    Comment: occasional   Drug use: No   Sexual activity: Not on file  Other Topics Concern   Not on file  Social History Narrative   Not on file   Social Drivers of Health   Financial Resource Strain: Medium Risk (08/01/2021)   Received from [facility], [facility]   Overall Financial Resource Strain (CARDIA)    Difficulty of Paying Living Expenses: Somewhat hard  Food Insecurity: No Food Insecurity (11/01/2021)   Received from [facility], [facility]   Hunger Vital Sign    Worried About Running Out of Food in the Last Year: Never true    Ran Out of Food in the Last Year: Never true  Transportation Needs: No Transportation Needs (  08/01/2021)   Received from [facility], [facility]   PRAPARE - Transportation    Lack of Transportation (Medical): No    Lack of Transportation (Non-Medical): No  Physical Activity: Inactive (08/01/2021)   Received from [facility], [facility]   Exercise Vital Sign    Days of Exercise per Week: 0 days    Minutes of Exercise per Session: 0 min  Stress: Stress Concern Present (10/13/2021)   Received from [facility], [facility] - Occupational Stress Questionnaire    Feeling of Stress : To some extent  Social Connections: Unknown (03/10/2022)   Received from [facility], [facility]   Social Network    Social Network: Not on file  Intimate Partner Violence: Unknown (01/30/2022)   Received from [facility], [facility]   HITS    Physically Hurt: Not on file    Insult or Talk Down To: Not on file    Threaten Physical Harm: Not on file    Scream or Curse: Not on file     Family History  Problem  Relation Age of Onset   Breast cancer Maternal Grandmother     Current Outpatient Medications  Medication Sig Dispense Refill   acyclovir (ZOVIRAX) 200 MG capsule Take 400 mg by mouth daily.  (Patient not taking: Reported on 07/25/2023)     ALPRAZolam  (XANAX ) 1 MG tablet Take 1 mg by mouth at bedtime.      B Complex-C (B-COMPLEX WITH VITAMIN C) tablet Take 1 tablet by mouth daily.     Betaine, Trimethylglycine, (TMG, TRIMETHYLGLYCINE,) 500 MG CAPS Take 1 capsule by mouth daily. (Patient not taking: Reported on 07/25/2023)     Cholecalciferol (D3 VITAMIN PO) Take 1,000 Units by mouth daily. (Patient not taking: Reported on 09/19/2023)     cyanocobalamin 1000 MCG tablet Take by mouth daily.      estradiol  (ESTRACE ) 0.1 MG/GM vaginal cream Apply to vaginal area 3 times weekly using a pea-sized amount on fingertip 42.5 g 12   metoprolol  succinate (TOPROL -XL) 50 MG 24 hr tablet Take 50 mg by mouth daily. Take with or immediately following a meal.     OVER THE COUNTER MEDICATION 100 mg daily. phosphotydiserine (Patient not taking: Reported on 07/25/2023)     OVER THE COUNTER MEDICATION 1 mL daily. Lions mane mushroon tonic     PARoxetine  (PAXIL ) 10 MG tablet Take 10 mg by mouth at bedtime.      saccharomyces boulardii ([employer] DIGESTIVE PROBIOTIC) 250 MG capsule Take 250 mg by mouth every other day.  (Patient not taking: Reported on 09/19/2023)     solifenacin  (VESICARE ) 10 MG tablet Take 1 tablet (10 mg total) by mouth daily. 30 tablet 5   No current facility-administered medications for this visit.    Allergies  Allergen Reactions   Taxol [Paclitaxel] Anaphylaxis   Shellfish Allergy Hives   Turmeric Rash    REVIEW OF SYSTEMS:   [X]  denotes positive finding, [ ]  denotes negative finding Cardiac  Comments:  Chest pain or chest pressure:    Shortness of breath upon exertion:    Short of breath when lying flat:    Irregular heart rhythm:        Vascular    Pain in calf, thigh, or hip brought  on by ambulation:    Pain in feet at night that wakes you up from your sleep:     Blood clot in your veins:    Leg swelling:  x       Pulmonary    Oxygen at home:    Productive cough:     Wheezing:         Neurologic    Sudden weakness in arms or legs:     Sudden numbness in arms or legs:     Sudden onset of difficulty speaking or slurred speech:    Temporary loss of vision in one eye:     Problems with dizziness:         Gastrointestinal    Blood in stool:     Vomited blood:         Genitourinary    Burning when urinating:     Blood in urine:        Psychiatric    Major depression:         Hematologic    Bleeding problems:    Problems with blood clotting too easily:        Skin    Rashes or ulcers:        Constitutional    Fever or chills:      PHYSICAL EXAMINATION:  Today's Vitals   11/08/23 1318 11/08/23 1322  BP: 138/79   Pulse: (!) 56   Resp: 18   Temp: 98 F (36.7 C)   TempSrc: Temporal   SpO2: 97%   Weight: 187 lb (84.8 kg)   Height: 5' 4 (1.626 m)   PainSc: 2  2    Body mass index is 32.1 kg/m.   General:  WDWN in NAD; vital signs documented above Gait: Not observed HENT: WNL, normocephalic Pulmonary: normal non-labored breathing without wheezing Cardiac: regular HR; without carotid bruits Abdomen: soft, NT, aortic pulse is not- palpable Skin: without rashes Vascular Exam/Pulses:  Right Left  Radial 2+ (normal) 2+ (normal)  DP 2+ (normal) 2+ (normal)   Extremities: BLE swelling with mild suerficial veins around the ankles.   Neurologic: A&O X 3;  moving all extremities equally Psychiatric:  The pt has Normal affect.   Non-Invasive Vascular Imaging:   Venous duplex on 11/08/2023: Venous Reflux Times  +--------------+---------+------+-----------+------------+--------+  RIGHT        Reflux NoRefluxReflux TimeDiameter cmsComments                          Yes                                    +--------------+---------+------+-----------+------------+--------+  CFV                    yes   >1 second                       +--------------+---------+------+-----------+------------+--------+  FV mid                  yes   >1 second                       +--------------+---------+------+-----------+------------+--------+  Popliteal              yes   >1 second                       +--------------+---------+------+-----------+------------+--------+  GSV at SFJ              yes    >500  ms      0.54              +--------------+---------+------+-----------+------------+--------+  GSV prox thigh          yes    >500 ms      0.61              +--------------+---------+------+-----------+------------+--------+  GSV mid thigh           yes    >500 ms      0.38              +--------------+---------+------+-----------+------------+--------+  GSV dist thighno                            0.31              +--------------+---------+------+-----------+------------+--------+  GSV at knee   no                            0.29              +--------------+---------+------+-----------+------------+--------+  SSV Pop Fossa no                            0.26              +--------------+---------+------+-----------+------------+--------+   Summary:  Right:  - No evidence of deep vein thrombosis from the common femoral through the  popliteal veins.  - No evidence of superficial venous thrombosis.  - The deep venous system is not competent.  - The great saphenous vein is not competent.  - The small saphenous vein is competent.     Sandra Wagner is a 73 y.o. female who presents with: with BLE swelling with right more symptomatic    -pt has easily palpable pedal pulses bilaterally -pt does not have evidence of DVT.  Pt does have venous reflux in the right deep venous system throughout as well as the GSV at the [facility] and the proximal  thigh.  She is not a candidate for laser ablation due to one small area in the superficial venous system.  -discussed with pt about wearing knee high 15-20 mmHg compression stockings and pt was measured for these today.    -discussed trying a teaspoon of mustard at night for nighttime cramps in her legs.  -discussed the importance of leg elevation and how to elevate properly - pt is advised to elevate their legs and a diagram is given to them to demonstrate for pt to lay flat on their back with knees elevated and slightly bent with their feet higher than their knees, which puts their feet higher than their heart for 15 minutes per day.  If pt cannot lay flat, advised to lay as flat as possible.  Discussed with her that this would be the position she would get into if she were to ever have any bleeding from the veins in her lower legs and hold pressure for 15-20 minutes and the bleeding will stop.  -pt is advised to continue as much walking as possible and avoid sitting or standing for long periods of time.  -discussed importance of weight loss and exercise and that water aerobics would also be beneficial.  -handout with recommendations given -pt will f/u as needed    Lucie Apt, [facility] [phone]  Clinic MD:  [place]

## 2024-01-16 DIAGNOSIS — B9689 Other specified bacterial agents as the cause of diseases classified elsewhere: Secondary | ICD-10-CM | POA: Diagnosis not present

## 2024-01-16 DIAGNOSIS — R509 Fever, unspecified: Secondary | ICD-10-CM | POA: Diagnosis not present

## 2024-01-16 DIAGNOSIS — J029 Acute pharyngitis, unspecified: Secondary | ICD-10-CM | POA: Diagnosis not present

## 2024-01-16 DIAGNOSIS — J018 Other acute sinusitis: Secondary | ICD-10-CM | POA: Diagnosis not present

## 2024-02-13 DIAGNOSIS — Z860100 Personal history of colon polyps, unspecified: Secondary | ICD-10-CM | POA: Diagnosis not present

## 2024-02-28 DIAGNOSIS — D123 Benign neoplasm of transverse colon: Secondary | ICD-10-CM | POA: Diagnosis not present

## 2024-02-28 DIAGNOSIS — K5140 Inflammatory polyps of colon without complications: Secondary | ICD-10-CM | POA: Diagnosis not present

## 2024-02-28 DIAGNOSIS — K648 Other hemorrhoids: Secondary | ICD-10-CM | POA: Diagnosis not present

## 2024-02-28 DIAGNOSIS — K5730 Diverticulosis of large intestine without perforation or abscess without bleeding: Secondary | ICD-10-CM | POA: Diagnosis not present

## 2024-02-28 DIAGNOSIS — K635 Polyp of colon: Secondary | ICD-10-CM | POA: Diagnosis not present

## 2024-02-28 DIAGNOSIS — Z860100 Personal history of colon polyps, unspecified: Secondary | ICD-10-CM | POA: Diagnosis not present

## 2024-02-28 DIAGNOSIS — Z1211 Encounter for screening for malignant neoplasm of colon: Secondary | ICD-10-CM | POA: Diagnosis not present

## 2024-03-19 ENCOUNTER — Ambulatory Visit: Payer: Medicare Other | Admitting: Urology

## 2024-04-02 DIAGNOSIS — R92323 Mammographic fibroglandular density, bilateral breasts: Secondary | ICD-10-CM | POA: Diagnosis not present

## 2024-04-02 DIAGNOSIS — Z1231 Encounter for screening mammogram for malignant neoplasm of breast: Secondary | ICD-10-CM | POA: Diagnosis not present

## 2024-04-09 DIAGNOSIS — Z853 Personal history of malignant neoplasm of breast: Secondary | ICD-10-CM | POA: Diagnosis not present

## 2024-04-09 DIAGNOSIS — Z1330 Encounter for screening examination for mental health and behavioral disorders, unspecified: Secondary | ICD-10-CM | POA: Diagnosis not present

## 2024-04-09 DIAGNOSIS — R6882 Decreased libido: Secondary | ICD-10-CM | POA: Diagnosis not present

## 2024-04-09 DIAGNOSIS — E039 Hypothyroidism, unspecified: Secondary | ICD-10-CM | POA: Diagnosis not present

## 2024-04-09 DIAGNOSIS — N898 Other specified noninflammatory disorders of vagina: Secondary | ICD-10-CM | POA: Diagnosis not present

## 2024-04-09 DIAGNOSIS — I10 Essential (primary) hypertension: Secondary | ICD-10-CM | POA: Diagnosis not present

## 2024-05-13 DIAGNOSIS — I10 Essential (primary) hypertension: Secondary | ICD-10-CM | POA: Diagnosis not present

## 2024-05-13 DIAGNOSIS — Z853 Personal history of malignant neoplasm of breast: Secondary | ICD-10-CM | POA: Diagnosis not present

## 2024-05-13 DIAGNOSIS — H9319 Tinnitus, unspecified ear: Secondary | ICD-10-CM | POA: Diagnosis not present

## 2024-05-13 DIAGNOSIS — E039 Hypothyroidism, unspecified: Secondary | ICD-10-CM | POA: Diagnosis not present

## 2024-05-13 DIAGNOSIS — F418 Other specified anxiety disorders: Secondary | ICD-10-CM | POA: Diagnosis not present

## 2024-05-13 DIAGNOSIS — Z1331 Encounter for screening for depression: Secondary | ICD-10-CM | POA: Diagnosis not present

## 2024-05-13 DIAGNOSIS — E669 Obesity, unspecified: Secondary | ICD-10-CM | POA: Diagnosis not present

## 2024-05-13 DIAGNOSIS — E559 Vitamin D deficiency, unspecified: Secondary | ICD-10-CM | POA: Diagnosis not present

## 2024-05-13 DIAGNOSIS — Z1339 Encounter for screening examination for other mental health and behavioral disorders: Secondary | ICD-10-CM | POA: Diagnosis not present

## 2024-05-13 DIAGNOSIS — E785 Hyperlipidemia, unspecified: Secondary | ICD-10-CM | POA: Diagnosis not present

## 2024-05-13 DIAGNOSIS — Z0000 Encounter for general adult medical examination without abnormal findings: Secondary | ICD-10-CM | POA: Diagnosis not present

## 2024-08-13 DIAGNOSIS — Z01419 Encounter for gynecological examination (general) (routine) without abnormal findings: Secondary | ICD-10-CM | POA: Diagnosis not present

## 2024-08-13 DIAGNOSIS — N3281 Overactive bladder: Secondary | ICD-10-CM | POA: Diagnosis not present

## 2024-08-13 DIAGNOSIS — Z124 Encounter for screening for malignant neoplasm of cervix: Secondary | ICD-10-CM | POA: Diagnosis not present

## 2024-08-20 ENCOUNTER — Other Ambulatory Visit: Payer: Self-pay | Admitting: Urology
